# Patient Record
Sex: Female | Born: 1955 | Race: White | Hispanic: No | Marital: Married | Smoking: Current every day smoker
Health system: Southern US, Community
[De-identification: ages and names within clinical notes are randomized; demographics above are authoritative.]

## PROBLEM LIST (undated history)

## (undated) DIAGNOSIS — C801 Malignant (primary) neoplasm, unspecified: Secondary | ICD-10-CM

## (undated) DIAGNOSIS — F32A Depression, unspecified: Secondary | ICD-10-CM

## (undated) DIAGNOSIS — E119 Type 2 diabetes mellitus without complications: Secondary | ICD-10-CM

## (undated) DIAGNOSIS — I10 Essential (primary) hypertension: Secondary | ICD-10-CM

## (undated) DIAGNOSIS — F329 Major depressive disorder, single episode, unspecified: Secondary | ICD-10-CM

## (undated) HISTORY — PX: APPENDECTOMY: SHX54

## (undated) HISTORY — PX: CHOLECYSTECTOMY: SHX55

## (undated) HISTORY — PX: TUBAL LIGATION: SHX77

---

## 1999-07-02 ENCOUNTER — Other Ambulatory Visit: Admission: RE | Admit: 1999-07-02 | Discharge: 1999-07-02 | Payer: Self-pay | Admitting: Obstetrics and Gynecology

## 2000-07-30 ENCOUNTER — Other Ambulatory Visit: Admission: RE | Admit: 2000-07-30 | Discharge: 2000-07-30 | Payer: Self-pay | Admitting: Obstetrics and Gynecology

## 2004-01-29 ENCOUNTER — Encounter: Admission: RE | Admit: 2004-01-29 | Discharge: 2004-01-29 | Payer: Self-pay | Admitting: Family Medicine

## 2005-06-12 ENCOUNTER — Other Ambulatory Visit: Admission: RE | Admit: 2005-06-12 | Discharge: 2005-06-12 | Payer: Self-pay | Admitting: Family Medicine

## 2007-01-25 ENCOUNTER — Other Ambulatory Visit: Admission: RE | Admit: 2007-01-25 | Discharge: 2007-01-25 | Payer: Self-pay | Admitting: Family Medicine

## 2008-07-23 ENCOUNTER — Ambulatory Visit (HOSPITAL_COMMUNITY): Admission: RE | Admit: 2008-07-23 | Discharge: 2008-07-23 | Payer: Self-pay | Admitting: Family Medicine

## 2010-04-09 ENCOUNTER — Other Ambulatory Visit: Admission: RE | Admit: 2010-04-09 | Discharge: 2010-04-09 | Payer: Self-pay | Admitting: Family Medicine

## 2012-05-17 ENCOUNTER — Encounter (HOSPITAL_BASED_OUTPATIENT_CLINIC_OR_DEPARTMENT_OTHER): Payer: PRIVATE HEALTH INSURANCE | Attending: General Surgery

## 2012-05-17 DIAGNOSIS — G5790 Unspecified mononeuropathy of unspecified lower limb: Secondary | ICD-10-CM | POA: Insufficient documentation

## 2012-05-17 DIAGNOSIS — Z79899 Other long term (current) drug therapy: Secondary | ICD-10-CM | POA: Insufficient documentation

## 2012-05-17 DIAGNOSIS — L97509 Non-pressure chronic ulcer of other part of unspecified foot with unspecified severity: Secondary | ICD-10-CM | POA: Insufficient documentation

## 2012-05-17 DIAGNOSIS — I10 Essential (primary) hypertension: Secondary | ICD-10-CM | POA: Insufficient documentation

## 2012-05-17 DIAGNOSIS — E785 Hyperlipidemia, unspecified: Secondary | ICD-10-CM | POA: Insufficient documentation

## 2012-05-17 DIAGNOSIS — E1169 Type 2 diabetes mellitus with other specified complication: Secondary | ICD-10-CM | POA: Insufficient documentation

## 2012-05-17 DIAGNOSIS — Z7982 Long term (current) use of aspirin: Secondary | ICD-10-CM | POA: Insufficient documentation

## 2012-05-17 DIAGNOSIS — F17200 Nicotine dependence, unspecified, uncomplicated: Secondary | ICD-10-CM | POA: Insufficient documentation

## 2012-05-17 LAB — GLUCOSE, CAPILLARY: Glucose-Capillary: 245 mg/dL — ABNORMAL HIGH (ref 70–99)

## 2012-05-17 NOTE — H&P (Signed)
NAMEPRESTYN, Christine Gould                 ACCOUNT NO.:  [id number]  MEDICAL RECORD NO.:  [id number]  LOCATION:  FOOT                         FACILITY:  [facility]  PHYSICIAN:  Joanne Gavel, M.D.        DATE OF BIRTH:  28-Jul-1956  DATE OF ADMISSION:  05/17/2012 DATE OF DISCHARGE:                             HISTORY & PHYSICAL   CHIEF COMPLAINT:  Wounds plantar surface both feet.  HISTORY OF PRESENT ILLNESS:  This is a 56 year old female who has been diabetic for approximately 5 years.  She is not insulin dependent. Several weeks ago she went to a water park where she walked all day with bare feet.  She developed plantar ulcerations bilaterally.  She has been treating these with soaks and antibiotic ointment.  PAST MEDICAL HISTORY:  Significant for diabetes, hyperlipidemia and hypertension.  PAST SURGICAL HISTORY:  Cholecystectomy, tubal ligation, and appendectomy.  SOCIAL HISTORY:  Cigarettes she smokes a pack daily, alcohol social.  ALLERGIES:  ACE INHIBITORS, LOVAZA, LIPITOR, SIMVASTATIN and TOPROL-XL.  MEDICATIONS:  Aspirin, Benicar, citalopram, amlodipine, glyburide, Welchol and trazodone.  REVIEW OF SYSTEMS:  Otherwise negative with no history of heart disease, lung disease or kidney disease.  PHYSICAL EXAMINATION:  VITAL SIGNS:  Temperature 98, pulse 73, respirations 18, blood pressure 121/80, glucose is 245.  ABI on the left is 1.22, on right is 1.32. HEAD, EYES, EARS, NOSE, THROAT:  Negative. CHEST:  Clear. HEART:  Regular rhythm. ABDOMEN:  Not examined. EXTREMITIES:  Examination of extremities reveals significant neuropathy of both feet and lower legs.  Peripheral pulses are not palpable.  There is not much swelling.  On the plantar surface of the left foot, there is 1.0 x 0.8 superficial wound.  On the right foot, there is a 0.3 x 0.2 superficial wound.  Both these wounds are underneath the metatarsal head.  The base of both wounds is relatively shaggy.  There is a  small amount of callus.  IMPRESSION:  Diabetic foot ulcers with neuropathy.  PLAN OF TREATMENT:  X-ray of feet to rule out osteo.  Arterial studies. Check sed rate and hemoglobin A1c.  Start with Santyl, Hydrogel and diabetic footwear to off load.  We will see the patient 2 weeks because she is going to be at the beach next week.  She will come sooner if there is any significant change.     Joanne Gavel, M.D.     RA/MEDQ  D:  05/17/2012  T:  05/17/2012  Job:  [id number]

## 2012-06-14 ENCOUNTER — Encounter (HOSPITAL_BASED_OUTPATIENT_CLINIC_OR_DEPARTMENT_OTHER): Payer: PRIVATE HEALTH INSURANCE

## 2012-07-12 ENCOUNTER — Other Ambulatory Visit (HOSPITAL_COMMUNITY)
Admission: RE | Admit: 2012-07-12 | Discharge: 2012-07-12 | Disposition: A | Discharge status: Home / Self Care | Payer: PRIVATE HEALTH INSURANCE | Source: Ambulatory Visit | Attending: Family Medicine | Admitting: Family Medicine

## 2012-07-12 ENCOUNTER — Other Ambulatory Visit: Payer: Self-pay | Admitting: Family Medicine

## 2012-07-12 DIAGNOSIS — Z124 Encounter for screening for malignant neoplasm of cervix: Secondary | ICD-10-CM | POA: Insufficient documentation

## 2013-11-01 ENCOUNTER — Other Ambulatory Visit: Payer: Self-pay | Admitting: Family Medicine

## 2013-11-01 DIAGNOSIS — R634 Abnormal weight loss: Secondary | ICD-10-CM

## 2013-11-01 DIAGNOSIS — R109 Unspecified abdominal pain: Secondary | ICD-10-CM

## 2013-11-02 ENCOUNTER — Other Ambulatory Visit: Payer: Self-pay | Admitting: Family Medicine

## 2013-11-02 ENCOUNTER — Ambulatory Visit
Admission: RE | Admit: 2013-11-02 | Discharge: 2013-11-02 | Disposition: A | Discharge status: Home / Self Care | Payer: PRIVATE HEALTH INSURANCE | Source: Ambulatory Visit | Attending: Family Medicine | Admitting: Family Medicine

## 2013-11-02 DIAGNOSIS — K8689 Other specified diseases of pancreas: Secondary | ICD-10-CM

## 2013-11-02 DIAGNOSIS — R160 Hepatomegaly, not elsewhere classified: Secondary | ICD-10-CM

## 2013-11-02 DIAGNOSIS — R109 Unspecified abdominal pain: Secondary | ICD-10-CM

## 2013-11-02 DIAGNOSIS — R634 Abnormal weight loss: Secondary | ICD-10-CM

## 2013-11-03 ENCOUNTER — Other Ambulatory Visit: Payer: Self-pay | Admitting: Oncology

## 2013-11-03 ENCOUNTER — Ambulatory Visit
Admission: RE | Admit: 2013-11-03 | Discharge: 2013-11-03 | Disposition: A | Discharge status: Home / Self Care | Payer: PRIVATE HEALTH INSURANCE | Source: Ambulatory Visit | Attending: Family Medicine | Admitting: Family Medicine

## 2013-11-03 ENCOUNTER — Telehealth: Payer: Self-pay | Admitting: Oncology

## 2013-11-03 DIAGNOSIS — K8689 Other specified diseases of pancreas: Secondary | ICD-10-CM

## 2013-11-03 DIAGNOSIS — R160 Hepatomegaly, not elsewhere classified: Secondary | ICD-10-CM

## 2013-11-03 DIAGNOSIS — C259 Malignant neoplasm of pancreas, unspecified: Secondary | ICD-10-CM

## 2013-11-03 MED ORDER — IOHEXOL 300 MG/ML  SOLN
100.0000 mL | Freq: Once | INTRAMUSCULAR | Status: AC | PRN
  Administered 2013-11-03: 100 mL via INTRAVENOUS

## 2013-11-03 NOTE — Telephone Encounter (Signed)
PATIENT SCHEDULED TO SEE DR. SHADAD 02/04 @ 1:30 PATIENT AWARE REFERRING DR. [name] [name] DX- LIVER/PANCREATIC MASS WELCOME PACKET MAILED.

## 2013-11-03 NOTE — Telephone Encounter (Signed)
C/D 11/03/13 for appt. 11/08/13

## 2013-11-06 ENCOUNTER — Encounter (HOSPITAL_COMMUNITY): Payer: Self-pay | Admitting: *Deleted

## 2013-11-06 ENCOUNTER — Other Ambulatory Visit: Payer: Self-pay | Admitting: Gastroenterology

## 2013-11-06 ENCOUNTER — Encounter (HOSPITAL_COMMUNITY): Payer: Self-pay | Admitting: Pharmacy Technician

## 2013-11-06 NOTE — Addendum Note (Signed)
Addended by: Arta Silence on: 11/06/2013 02:59 PM   Modules accepted: Orders

## 2013-11-07 ENCOUNTER — Ambulatory Visit (HOSPITAL_COMMUNITY): Payer: PRIVATE HEALTH INSURANCE | Admitting: Anesthesiology

## 2013-11-07 ENCOUNTER — Encounter (HOSPITAL_COMMUNITY)
Admission: RE | Disposition: A | Discharge status: Home / Self Care | Payer: Self-pay | Source: Ambulatory Visit | Attending: Gastroenterology

## 2013-11-07 ENCOUNTER — Encounter (HOSPITAL_COMMUNITY): Payer: Self-pay

## 2013-11-07 ENCOUNTER — Ambulatory Visit (HOSPITAL_COMMUNITY)
Admission: RE | Admit: 2013-11-07 | Discharge: 2013-11-07 | Disposition: A | Discharge status: Home / Self Care | Payer: PRIVATE HEALTH INSURANCE | Source: Ambulatory Visit | Attending: Gastroenterology | Admitting: Gastroenterology

## 2013-11-07 ENCOUNTER — Encounter (HOSPITAL_COMMUNITY): Payer: PRIVATE HEALTH INSURANCE | Admitting: Anesthesiology

## 2013-11-07 DIAGNOSIS — E119 Type 2 diabetes mellitus without complications: Secondary | ICD-10-CM | POA: Insufficient documentation

## 2013-11-07 DIAGNOSIS — F17200 Nicotine dependence, unspecified, uncomplicated: Secondary | ICD-10-CM | POA: Insufficient documentation

## 2013-11-07 DIAGNOSIS — C252 Malignant neoplasm of tail of pancreas: Secondary | ICD-10-CM | POA: Insufficient documentation

## 2013-11-07 DIAGNOSIS — I10 Essential (primary) hypertension: Secondary | ICD-10-CM | POA: Insufficient documentation

## 2013-11-07 DIAGNOSIS — Z79899 Other long term (current) drug therapy: Secondary | ICD-10-CM | POA: Insufficient documentation

## 2013-11-07 HISTORY — DX: Essential (primary) hypertension: I10

## 2013-11-07 HISTORY — DX: Malignant (primary) neoplasm, unspecified: C80.1

## 2013-11-07 HISTORY — PX: FINE NEEDLE ASPIRATION: SHX5430

## 2013-11-07 HISTORY — DX: Type 2 diabetes mellitus without complications: E11.9

## 2013-11-07 HISTORY — DX: Major depressive disorder, single episode, unspecified: F32.9

## 2013-11-07 HISTORY — PX: EUS: SHX5427

## 2013-11-07 HISTORY — DX: Depression, unspecified: F32.A

## 2013-11-07 LAB — GLUCOSE, CAPILLARY: Glucose-Capillary: 198 mg/dL — ABNORMAL HIGH (ref 70–99)

## 2013-11-07 SURGERY — ESOPHAGEAL ENDOSCOPIC ULTRASOUND (EUS) RADIAL
Anesthesia: Monitor Anesthesia Care

## 2013-11-07 MED ORDER — PROPOFOL 10 MG/ML IV BOLUS
INTRAVENOUS | Status: AC
  Filled 2013-11-07: qty 20

## 2013-11-07 MED ORDER — ONDANSETRON HCL 4 MG/2ML IJ SOLN
INTRAMUSCULAR | Status: DC | PRN
  Administered 2013-11-07: 4 mg via INTRAVENOUS

## 2013-11-07 MED ORDER — LACTATED RINGERS IV SOLN
INTRAVENOUS | Status: DC | PRN
  Administered 2013-11-07 (×2): via INTRAVENOUS

## 2013-11-07 MED ORDER — MIDAZOLAM HCL 2 MG/2ML IJ SOLN
INTRAMUSCULAR | Status: AC
  Filled 2013-11-07: qty 2

## 2013-11-07 MED ORDER — KETAMINE HCL 10 MG/ML IJ SOLN
INTRAMUSCULAR | Status: AC
  Filled 2013-11-07: qty 1

## 2013-11-07 MED ORDER — BUTAMBEN-TETRACAINE-BENZOCAINE 2-2-14 % EX AERO
INHALATION_SPRAY | CUTANEOUS | Status: DC | PRN
  Administered 2013-11-07: 2 via TOPICAL

## 2013-11-07 MED ORDER — PROPOFOL INFUSION 10 MG/ML OPTIME
INTRAVENOUS | Status: DC | PRN
  Administered 2013-11-07: 75 ug/kg/min via INTRAVENOUS

## 2013-11-07 MED ORDER — FENTANYL CITRATE 0.05 MG/ML IJ SOLN
INTRAMUSCULAR | Status: AC
  Filled 2013-11-07: qty 2

## 2013-11-07 MED ORDER — MIDAZOLAM HCL 5 MG/5ML IJ SOLN
INTRAMUSCULAR | Status: DC | PRN
  Administered 2013-11-07: 2 mg via INTRAVENOUS

## 2013-11-07 MED ORDER — PROPOFOL 10 MG/ML IV BOLUS
INTRAVENOUS | Status: DC | PRN
  Administered 2013-11-07 (×2): 20 mg via INTRAVENOUS

## 2013-11-07 NOTE — Anesthesia Preprocedure Evaluation (Signed)
Anesthesia Evaluation  Patient identified by MRN, date of birth, ID band Patient awake    Reviewed: Allergy & Precautions, H&P , NPO status , Patient's Chart, lab work & pertinent test results  Airway Mallampati: II TM Distance: >3 FB Neck ROM: Full    Dental  (+) Teeth Intact and Dental Advisory Given   Pulmonary neg pulmonary ROS, Current Smoker,  breath sounds clear to auscultation  Pulmonary exam normal       Cardiovascular hypertension, Pt. on medications Rhythm:Regular Rate:Normal     Neuro/Psych negative neurological ROS  negative psych ROS   GI/Hepatic Neg liver ROS, Pancreatic mass   Endo/Other  diabetes, Type 2, Oral Hypoglycemic Agents  Renal/GU negative Renal ROS  negative genitourinary   Musculoskeletal negative musculoskeletal ROS (+)   Abdominal   Peds negative pediatric ROS (+)  Hematology negative hematology ROS (+)   Anesthesia Other Findings   Reproductive/Obstetrics                           Anesthesia Physical Anesthesia Plan  ASA: II  Anesthesia Plan: MAC   Post-op Pain Management:    Induction: Intravenous  Airway Management Planned: Simple Face Mask and Nasal Cannula  Additional Equipment:   Intra-op Plan:   Post-operative Plan:   Informed Consent:   Dental advisory given  Plan Discussed with: CRNA  Anesthesia Plan Comments:         Anesthesia Quick Evaluation

## 2013-11-07 NOTE — H&P (Signed)
Patient interval history reviewed.  Patient examined again.  There has been no change from documented H/P dated 11/03/13 (scanned into chart from our office) except as documented above.  Assessment:  1. Pancreatic mass, with concern for malignancy with carcinomatosis.  Plan:  1.  Endoscopic ultrasound with possible fine needle aspiration. 2.  Risks (bleeding, infection, bowel perforation that could require surgery, sedation-related changes in cardiopulmonary systems), benefits (identification and possible treatment of source of symptoms, exclusion of certain causes of symptoms), and alternatives (watchful waiting, radiographic imaging studies, empiric medical treatment) of upper endoscopy with ultrasound and possible fine needle aspiration biopsies (EUS +/- FNA) were explained to patient/family in detail and patient wishes to proceed.

## 2013-11-07 NOTE — Discharge Instructions (Signed)

## 2013-11-07 NOTE — Op Note (Signed)
[facility] [place], [address]   ENDOSCOPIC ULTRASOUND PROCEDURE REPORT  PATIENT: Christine Gould, Christine Gould  MR#: [id number] BIRTHDATE: 1956/03/21  GENDER: Female ENDOSCOPIST: Arta Silence, MD REFERRED BY:  Acquanetta Sit, M.D. PROCEDURE DATE:  11/07/2013 PROCEDURE:   Upper EUS w/FNA ASA CLASS:      Class III INDICATIONS:   1.  pancreatic mass. MEDICATIONS: MAC sedation, administered by CRNA and Cetacaine spray x 2  DESCRIPTION OF PROCEDURE:   After the risks benefits and alternatives of the procedure were  explained, informed consent was obtained. The patient was then placed in the left, lateral, decubitus postion and IV sedation was administered. Throughout the procedure, the patients blood pressure, pulse and oxygen saturations were monitored continuously.  Under direct visualization, the     endoscope was introduced through the mouth and advanced to the stomach antrum .  Water was used as necessary to provide an acoustic interface.  Upon completion of the imaging, water was removed and the patient was sent to the recovery room in satisfactory condition.     FINDINGS:      Large irregular hypoechoic mass in tail of pancreas, at least 4 x 4 cm in size.  Mass biopsied x 3 with 25-g needle, preliminary cytology, reviewed in my presence by Dr. Nicoletta Dress, worrisome for adenocarcinoma.  Notation made of recent CT scan showing extensive peritumoral and distant adenopathy, as well as carcinomatosis.  IMPRESSION:     Pancreatic mass, biopsied as above.  RECOMMENDATIONS:     1.  Watch for potential complications of procedure. 2.  Await final cytology results. 3.  Patient does not appear to be operative candidate; if biopsies confirm adenocarcinoma, would need to set up expedited oncology consultation for further management recommendations.   _______________________________ Lorrin MaisArta Silence, MD 11/07/2013 3:48 PM   CC:

## 2013-11-07 NOTE — Anesthesia Postprocedure Evaluation (Signed)
Anesthesia Post Note  Patient: Christine Gould  Procedure(s) Performed: Procedure(s) (LRB): ESOPHAGEAL ENDOSCOPIC ULTRASOUND (EUS) RADIAL (N/A) FINE NEEDLE ASPIRATION (FNA) LINEAR (N/A)  Anesthesia type: General  Patient location: PACU  Post pain: Pain level controlled  Post assessment: Post-op Vital signs reviewed  Last Vitals:  Filed Vitals:   11/07/13 1550  BP: 146/110  Temp:   Resp: 23    Post vital signs: Reviewed  Level of consciousness: sedated  Complications: No apparent anesthesia complications

## 2013-11-07 NOTE — Preoperative (Signed)
Beta Blockers   Reason not to administer Beta Blockers:Not Applicable 

## 2013-11-07 NOTE — Transfer of Care (Signed)
Immediate Anesthesia Transfer of Care Note  Patient: Christine Gould  Procedure(s) Performed: Procedure(s) (LRB): ESOPHAGEAL ENDOSCOPIC ULTRASOUND (EUS) RADIAL (N/A) FINE NEEDLE ASPIRATION (FNA) LINEAR (N/A)  Patient Location: PACU  Anesthesia Type: MAC  Level of Consciousness: sedated, patient cooperative and responds to stimulation  Airway & Oxygen Therapy: Patient Spontanous Breathing and Patient connected to face mask oxgen  Post-op Assessment: Report given to PACU RN and Post -op Vital signs reviewed and stable  Post vital signs: Reviewed and stable  Complications: No apparent anesthesia complications

## 2013-11-08 ENCOUNTER — Ambulatory Visit (HOSPITAL_BASED_OUTPATIENT_CLINIC_OR_DEPARTMENT_OTHER): Payer: PRIVATE HEALTH INSURANCE | Admitting: Oncology

## 2013-11-08 ENCOUNTER — Other Ambulatory Visit (HOSPITAL_BASED_OUTPATIENT_CLINIC_OR_DEPARTMENT_OTHER): Payer: PRIVATE HEALTH INSURANCE

## 2013-11-08 ENCOUNTER — Ambulatory Visit: Payer: PRIVATE HEALTH INSURANCE

## 2013-11-08 ENCOUNTER — Encounter: Payer: Self-pay | Admitting: Oncology

## 2013-11-08 ENCOUNTER — Encounter (HOSPITAL_COMMUNITY): Payer: Self-pay | Admitting: Gastroenterology

## 2013-11-08 VITALS — BP 117/71 | HR 98 | Temp 97.8°F | Resp 18 | Ht 65.0 in | Wt 166.4 lb

## 2013-11-08 DIAGNOSIS — C252 Malignant neoplasm of tail of pancreas: Secondary | ICD-10-CM

## 2013-11-08 DIAGNOSIS — C259 Malignant neoplasm of pancreas, unspecified: Secondary | ICD-10-CM

## 2013-11-08 DIAGNOSIS — C787 Secondary malignant neoplasm of liver and intrahepatic bile duct: Secondary | ICD-10-CM

## 2013-11-08 DIAGNOSIS — R109 Unspecified abdominal pain: Secondary | ICD-10-CM

## 2013-11-08 DIAGNOSIS — E119 Type 2 diabetes mellitus without complications: Secondary | ICD-10-CM

## 2013-11-08 DIAGNOSIS — F329 Major depressive disorder, single episode, unspecified: Secondary | ICD-10-CM

## 2013-11-08 DIAGNOSIS — C786 Secondary malignant neoplasm of retroperitoneum and peritoneum: Secondary | ICD-10-CM

## 2013-11-08 DIAGNOSIS — F3289 Other specified depressive episodes: Secondary | ICD-10-CM

## 2013-11-08 DIAGNOSIS — I10 Essential (primary) hypertension: Secondary | ICD-10-CM

## 2013-11-08 LAB — CBC WITH DIFFERENTIAL/PLATELET
BASO%: 0.6 % (ref 0.0–2.0)
BASOS ABS: 0 10*3/uL (ref 0.0–0.1)
EOS%: 0.4 % (ref 0.0–7.0)
Eosinophils Absolute: 0 10*3/uL (ref 0.0–0.5)
HEMATOCRIT: 43.5 % (ref 34.8–46.6)
HGB: 14.6 g/dL (ref 11.6–15.9)
LYMPH%: 22.2 % (ref 14.0–49.7)
MCH: 31.7 pg (ref 25.1–34.0)
MCHC: 33.4 g/dL (ref 31.5–36.0)
MCV: 94.8 fL (ref 79.5–101.0)
MONO#: 0.5 10*3/uL (ref 0.1–0.9)
MONO%: 7.6 % (ref 0.0–14.0)
NEUT#: 4.8 10*3/uL (ref 1.5–6.5)
NEUT%: 69.2 % (ref 38.4–76.8)
PLATELETS: 174 10*3/uL (ref 145–400)
RBC: 4.59 10*6/uL (ref 3.70–5.45)
RDW: 13.1 % (ref 11.2–14.5)
WBC: 6.9 10*3/uL (ref 3.9–10.3)
lymph#: 1.5 10*3/uL (ref 0.9–3.3)

## 2013-11-08 LAB — COMPREHENSIVE METABOLIC PANEL (CC13)
ALT: 25 U/L (ref 0–55)
ANION GAP: 15 meq/L — AB (ref 3–11)
AST: 19 U/L (ref 5–34)
Albumin: 3.5 g/dL (ref 3.5–5.0)
Alkaline Phosphatase: 108 U/L (ref 40–150)
BUN: 4.1 mg/dL — AB (ref 7.0–26.0)
CALCIUM: 9.6 mg/dL (ref 8.4–10.4)
CO2: 25 meq/L (ref 22–29)
CREATININE: 0.7 mg/dL (ref 0.6–1.1)
Chloride: 93 mEq/L — ABNORMAL LOW (ref 98–109)
Glucose: 287 mg/dl — ABNORMAL HIGH (ref 70–140)
Potassium: 3.7 mEq/L (ref 3.5–5.1)
Sodium: 133 mEq/L — ABNORMAL LOW (ref 136–145)
Total Bilirubin: 0.38 mg/dL (ref 0.20–1.20)
Total Protein: 6.5 g/dL (ref 6.4–8.3)

## 2013-11-08 NOTE — Progress Notes (Signed)
Checked in new patient she has appt card and no financial issues.

## 2013-11-08 NOTE — Consult Note (Signed)
Reason for Referral: Pancreatic cancer.   HPI: 58 year old woman currently of [place] where she lived the majority of her life. She has a past medical history significant for hypertension and diabetes that have been otherwise in reasonably good health. 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 Her symptoms started about 2 months ago with abdominal pain and weight loss. Her pain is predominantly epigastric causing a band like discomfort and radiating to the back. Is not associated with any nausea or vomiting. Some associated with any change in her bowel habits including hematochezia or diarrhea. She has noticed weight loss and change in taste of her food. She was evaluated by her primary care physician initially with an ultrasound on 11/02/2013 which showed masses around the right lower liver as well as pancreatic mass. Her CT scan of the abdomen and pelvis showed a poorly defined tail of the pancreas mass measuring 5.4 x 4.0 associated with peripancreatic lymph nodes as well as carcinomatosis. There is also tumors in the liver involving the left upper quadrant at 8.4 and 2.6 cm. Patient subsequently underwent an endoscopy and a biopsy utilizing endoscopic ultrasound on 11/07/2013. The results from that fine-needle aspiration case number [id number] showed malignant cells most likely consistent with poorly differentiated adenocarcinoma. Patient referred to me for the evaluation and management of these findings. Clinically, she is quite symptomatic from this she is reporting midepigastric abdominal pain, fatigue and weight loss. She has not reported any chills or night sweats. She has not reported any jaundice or lower extremity edema or thrombosis. Her abdominal pain is poorly controlled at times. She still has a reasonable performance status and was able to work up to last week.   Past Medical History  Diagnosis Date  . Hypertension   . Depression   . Diabetes mellitus without complication    . Cancer     Dx. 11-02-13 Pancreatic Cancer  :  Past Surgical History  Procedure Laterality Date  . Appendectomy    . Tubal ligation    . Cholecystectomy      open 70's  . Eus N/A 11/07/2013    Procedure: ESOPHAGEAL ENDOSCOPIC ULTRASOUND (EUS) RADIAL;  Surgeon: Arta Silence, MD;  Location: [facility];  Service: Endoscopy;  Laterality: N/A;  . Fine needle aspiration N/A 11/07/2013    Procedure: FINE NEEDLE ASPIRATION (FNA) LINEAR;  Surgeon: Arta Silence, MD;  Location: [facility];  Service: Endoscopy;  Laterality: N/A;  :  No current outpatient prescriptions on file.:  Allergies  Allergen Reactions  . Statins Other (See Comments)    Cramps-fatigue  . Toprol Xl [Metoprolol]   :  No family history on file.:  History   Social History  . Marital Status: Married    Spouse Name: N/A    Number of Children: N/A  . Years of Education: N/A   Occupational History  . Not on file.   Social History Main Topics  . Smoking status: Current Every Day Smoker -- 0.50 packs/day for 35 years    Types: Cigarettes  . Smokeless tobacco: Not on file  . Alcohol Use: Yes     Comment: occ. -social  . Drug Use: No  . Sexual Activity: Not Currently   Other Topics Concern  . Not on file   Social History Narrative  . No narrative on file  :  Constitutional: negative for chills, fevers and night sweats Eyes: negative for cataracts, icterus and redness Ears, nose, mouth, throat, and face: negative for ear drainage,  hearing loss and sore mouth Respiratory: negative for dyspnea on exertion, hemoptysis and wheezing Cardiovascular: negative for exertional chest pressure/discomfort, irregular heart beat, orthopnea and paroxysmal nocturnal dyspnea Gastrointestinal: negative for diarrhea, jaundice, melena and vomiting Genitourinary:negative for frequency, hematuria and nocturia Integument/breast: negative for pruritus, rash and skin color change Hematologic/lymphatic: negative for bleeding,  easy bruising and lymphadenopathy Musculoskeletal:negative for arthralgias, back pain and bone pain Neurological: negative for coordination problems Behavioral/Psych: negative for behavior problems and depression Endocrine: negative for temperature intolerance Allergic/Immunologic: negative for hay fever and urticaria  Exam: Blood pressure 117/71, pulse 98, temperature 97.8 F (36.6 C), temperature source Oral, resp. rate 18, height 5\' 5"  (1.651 m), weight 166 lb 6.4 oz (75.479 kg). General appearance: alert, cooperative and appears stated age Head: Normocephalic, without obvious abnormality, atraumatic Nose: Nares normal. Septum midline. Mucosa normal. No drainage or sinus tenderness. Throat: lips, mucosa, and tongue normal; teeth and gums normal Neck: no adenopathy, no carotid bruit, no JVD, supple, symmetrical, trachea midline and thyroid not enlarged, symmetric, no tenderness/mass/nodules Back: symmetric, no curvature. ROM normal. No CVA tenderness. Resp: clear to auscultation bilaterally Chest wall: no tenderness Cardio: regular rate and rhythm, S1, S2 normal, no murmur, click, rub or gallop GI: soft, non-tender; bowel sounds normal; no masses,  no organomegaly Extremities: extremities normal, atraumatic, no cyanosis or edema Pulses: 2+ and symmetric Skin: Skin color, texture, turgor normal. No rashes or lesions Lymph nodes: Cervical, supraclavicular, and axillary nodes normal. Neurologic: Grossly normal   Recent Labs  11/08/13 1322  WBC 6.9  HGB 14.6  HCT 43.5  PLT 174    Recent Labs  11/08/13 1323  NA 133*  K 3.7  CO2 25  GLUCOSE 287*  BUN 4.1*  CREATININE 0.7  CALCIUM 9.6       US Abdomen Complete  11/02/2013   CLINICAL DATA:  Abdominal pain and weight loss.  EXAM: ULTRASOUND ABDOMEN COMPLETE  COMPARISON:  Ultrasound dated 01/29/2004  FINDINGS: Gallbladder:  Removed.  Common bile duct:  Diameter: 2.9 mm, normal.  Liver:  There is a 3.6 x 3.2 x 1.6 cm mixed  solid and cystic mass at inferior margin of the right lobe of the liver. There is a 1.7 x 1.1 x 1.6 cm mass which line is anterior to the left lobe of the liver.  IVC:  Normal.  Pancreas:  There is a poorly defined at 3.7 x 2.7 x 3.2 cm mass in the head of the pancreas. There is a better defined solid mass in the mid pancreas measuring 2.5 x 2.0 x 2.0 cm.  Spleen:  Normal.  8.2 cm in length.  Right Kidney:  Length: 11.8 cm. 1.9 cm cyst in the mid right kidney. Otherwise normal.  Left Kidney:  Length: 1.7 cm. Echogenicity within normal limits. No mass or hydronephrosis visualized.  Abdominal aorta:  Normal.  2.5 cm maximum diameter.  Other findings:  Small amount of ascites.  IMPRESSION: Masses in the liver and in and around the pancreas as well as anterior to the left lobe of the liver. Findings are consistent with malignancy. Small amount of ascites.   Electronically Signed   By: Rozetta Nunnery M.D.   On: 11/02/2013 09:26   Ct Abdomen Pelvis W Contrast  11/03/2013   CLINICAL DATA:  Abnormal ultrasound abdomen with masses at liver and pancreas, abdominal pain, nausea, weight loss, past history cholecystectomy, appendectomy, tubal ligation  EXAM: CT ABDOMEN AND PELVIS WITH CONTRAST  TECHNIQUE: Multidetector CT imaging of the abdomen and pelvis  was performed using the standard protocol following bolus administration of intravenous contrast. Sagittal and coronal MPR images reconstructed from axial data set.  CONTRAST:  163mL OMNIPAQUE IOHEXOL 300 MG/ML SOLN. Dilute oral contrast.  COMPARISON:  Ultrasound abdomen 11/02/2013  FINDINGS: Lung bases clear.  Poorly defined low-attenuation mass at pancreatic tail consistent with primary pancreatic neoplasm.  Lesion measures 5.4 x 4.0 cm image 27 and is contiguous with the posterior gastric wall.  Lesion extends to very close to the hepatic flexure of the colon.  Additionally, enlarged peripancreatic lymph nodes identified, 2.8 x 2.2 cm image 32 and 2.3 x 1.8 cm image 29.   Multiple peritoneal based soft tissue masses/ nodules identified knee upper abdomen compatible with peritoneal carcinomatosis.  These include a large mass of the lateral left upper quadrant, 8.4 x 2.6 cm image 42 and small perihepatic nodules as well as a serosal mass at the inferior right lobe 3.4 by 2.4 cm image 32.  Small amount of perihepatic and perisplenic free intraperitoneal fluid.  Scattered omental infiltration.  Small amount of free fluid in pelvis with nodular enhancing margins compatible with peritoneal carcinomatosis as well.  No additional abnormalities of the liver, spleen, adrenal glands, or kidneys.  Remainder of pancreas normal appearance.  No evidence of bowel obstruction or dilatation.  Unremarkable bladder, uterus and adnexae.  No definite osseous lesions identified.  IMPRESSION: 5.4 x 4.0 cm diameter pancreatic tail mass most consistent with a primary pancreatic neoplasm.  Question extension of tumor to posterior gastric wall and very near the splenic flexure of the colon.  Peripancreatic adenopathy and significant peritoneal carcinomatosis changes as above.   Electronically Signed   By: Lavonia Dana M.D.   On: 11/03/2013 16:15    Assessment and Plan:   58 year old woman with the following issues:  1. 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 This was diagnosed with an endoscopic ultrasound fine-needle aspiration on 11/07/2013. She presented with abdominal pain and weight loss. The natural course of stage IV pancreatic cancer was discussed today with the patient and her family. I have explained to her the treatment options at this time extensively. I do not believe there is a role for surgery given the fact that her disease is advanced and rather bulky. Radiation therapy would not likely add any palliation for her pain at this time and that leaves [place] with systemic  chemotherapy. I discussed with her the role and goals of systemic chemotherapy in general in this particular situation. I made it clear to her and her family that systemic chemotherapy is mainly palliative and we'll offer little to no chance of to remission.  Different regimens of systemic chemotherapy were discussed today in detail. These include single agent gemcitabine, combination of gemcitabine and Abraxane, and combination of 5-FU, leucovorin, oxaliplatin, and CPT-11. Risks and benefits of these regimens were discussed today in detail including the logistics of administration and complications. I have explained to them that in general chemotherapy can cause nausea, vomiting, myelosuppression and possible neutropenia and neutropenic sepsis. The risk of adding Abraxane increase his peripheral neuropathy and the combination of CPT-11 and oxaliplatin can cause more neuropathy as well as diarrhea and increased risk of neutropenic sepsis and possible hospitalization. They also understand that and doesn't find treatment improves the overall survival but certainly not curative.  Of also explained to her the logistics of intravenous chemotherapy administration including the need as well as the use of  supportive measures such as Port-A-Cath insertion, the use of anti-emetic.  Alternatively, if she decided not to proceed with systemic chemotherapy the logical conclusion would be the cancer continued to spread and eminently cause more pain and discomfort and referral to hospice.  2. Abdominal pain: She is currently utilizing hydrocodone with acetaminophen and she might require stronger pain medication and possibly long-acting narcotics.  3. Constipation: Is going to be possibly an issue especially with utilizing stronger narcotics and dehydration an adequate bowel regimen is very critical.  4. Thrombosis: I've explained to her the risks associated with this particular malignancy would increase the risk of  thromboembolism I have educated her on possible ways prevented in the future.  Clearly the all prognosis is poor and she clearly understands this and she is to decide with her family which we will like to proceed and we will arrange for a followup and infusions of chemotherapy should she decides to proceed with that.

## 2013-11-08 NOTE — Progress Notes (Signed)
Please see consult note.  

## 2013-11-09 LAB — CANCER ANTIGEN 19-9: CA 19 9: 37431.5 U/mL — AB (ref <35.0)

## 2013-11-10 ENCOUNTER — Other Ambulatory Visit: Payer: Self-pay | Admitting: *Deleted

## 2013-11-10 ENCOUNTER — Telehealth: Payer: Self-pay | Admitting: *Deleted

## 2013-11-10 MED ORDER — LIDOCAINE-PRILOCAINE 2.5-2.5 % EX CREA: 1.0000 "application " | TOPICAL_CREAM | CUTANEOUS | Status: DC | PRN

## 2013-11-10 MED ORDER — OXYCODONE HCL 5 MG PO TABS: 5.0000 mg | ORAL_TABLET | ORAL | Status: DC | PRN

## 2013-11-10 MED ORDER — PROCHLORPERAZINE MALEATE 10 MG PO TABS: 10.0000 mg | ORAL_TABLET | Freq: Four times a day (QID) | ORAL | Status: DC | PRN

## 2013-11-10 NOTE — Addendum Note (Signed)
Addended by: Wyatt Portela on: 11/10/2013 01:54 PM   Modules accepted: Orders

## 2013-11-10 NOTE — Telephone Encounter (Signed)
E-scribed emla cream and compazine to patient's pharmacy.called patient, left message on answering machine to call me.

## 2013-11-10 NOTE — Telephone Encounter (Signed)
No note

## 2013-11-10 NOTE — Telephone Encounter (Signed)
Patient calling for something for pain. Per dr Alen Blew, oxycodone 5mg  script left at front. Patient notified.

## 2013-11-10 NOTE — Telephone Encounter (Signed)
Patient returned my call, states central scheduling has already called her to set up port-a-cath placement. emla and compazine called to her pharmacy. Patient aware.

## 2013-11-13 ENCOUNTER — Telehealth: Payer: Self-pay | Admitting: Medical Oncology

## 2013-11-13 ENCOUNTER — Telehealth: Payer: Self-pay | Admitting: Oncology

## 2013-11-13 NOTE — Telephone Encounter (Signed)
Patient called confirming appt for Chemo education class sched for 02/12 @ 1230.  Denies questions at this time.

## 2013-11-13 NOTE — Telephone Encounter (Signed)
lvm for pt regarding to Feb appts...amield pt appt sched/avs and letter

## 2013-11-14 ENCOUNTER — Other Ambulatory Visit: Payer: Self-pay | Admitting: Radiology

## 2013-11-14 ENCOUNTER — Other Ambulatory Visit: Payer: PRIVATE HEALTH INSURANCE

## 2013-11-14 ENCOUNTER — Ambulatory Visit: Payer: PRIVATE HEALTH INSURANCE

## 2013-11-15 ENCOUNTER — Encounter (HOSPITAL_COMMUNITY): Payer: Self-pay | Admitting: Pharmacy Technician

## 2013-11-16 ENCOUNTER — Other Ambulatory Visit: Payer: PRIVATE HEALTH INSURANCE

## 2013-11-16 ENCOUNTER — Encounter: Payer: Self-pay | Admitting: *Deleted

## 2013-11-16 NOTE — Progress Notes (Signed)
Patient brought FMLA forms to chemo class.  Given to managed care.

## 2013-11-17 ENCOUNTER — Encounter: Payer: Self-pay | Admitting: Oncology

## 2013-11-17 ENCOUNTER — Ambulatory Visit: Payer: PRIVATE HEALTH INSURANCE | Admitting: Oncology

## 2013-11-17 ENCOUNTER — Ambulatory Visit: Payer: PRIVATE HEALTH INSURANCE

## 2013-11-17 NOTE — Progress Notes (Signed)
Put [employer] and fmla form in registration desk.

## 2013-11-17 NOTE — Progress Notes (Signed)
Faxed disability form to [employer] @ [phone] °

## 2013-11-20 ENCOUNTER — Ambulatory Visit (HOSPITAL_COMMUNITY)
Admission: RE | Admit: 2013-11-20 | Discharge: 2013-11-20 | Disposition: A | Discharge status: Home / Self Care | Payer: PRIVATE HEALTH INSURANCE | Source: Ambulatory Visit | Attending: Oncology | Admitting: Oncology

## 2013-11-20 ENCOUNTER — Other Ambulatory Visit: Payer: Self-pay | Admitting: Oncology

## 2013-11-20 ENCOUNTER — Encounter (HOSPITAL_COMMUNITY): Payer: Self-pay

## 2013-11-20 DIAGNOSIS — F3289 Other specified depressive episodes: Secondary | ICD-10-CM | POA: Insufficient documentation

## 2013-11-20 DIAGNOSIS — I10 Essential (primary) hypertension: Secondary | ICD-10-CM | POA: Insufficient documentation

## 2013-11-20 DIAGNOSIS — F329 Major depressive disorder, single episode, unspecified: Secondary | ICD-10-CM | POA: Insufficient documentation

## 2013-11-20 DIAGNOSIS — C259 Malignant neoplasm of pancreas, unspecified: Secondary | ICD-10-CM

## 2013-11-20 DIAGNOSIS — Z79899 Other long term (current) drug therapy: Secondary | ICD-10-CM | POA: Insufficient documentation

## 2013-11-20 DIAGNOSIS — E119 Type 2 diabetes mellitus without complications: Secondary | ICD-10-CM | POA: Insufficient documentation

## 2013-11-20 DIAGNOSIS — F17200 Nicotine dependence, unspecified, uncomplicated: Secondary | ICD-10-CM | POA: Insufficient documentation

## 2013-11-20 LAB — CBC
HCT: 42.4 % (ref 36.0–46.0)
Hemoglobin: 14.9 g/dL (ref 12.0–15.0)
MCH: 31.5 pg (ref 26.0–34.0)
MCHC: 35.1 g/dL (ref 30.0–36.0)
MCV: 89.6 fL (ref 78.0–100.0)
Platelets: 172 10*3/uL (ref 150–400)
RBC: 4.73 MIL/uL (ref 3.87–5.11)
RDW: 12.5 % (ref 11.5–15.5)
WBC: 6 10*3/uL (ref 4.0–10.5)

## 2013-11-20 LAB — GLUCOSE, CAPILLARY: Glucose-Capillary: 198 mg/dL — ABNORMAL HIGH (ref 70–99)

## 2013-11-20 LAB — APTT: aPTT: 27 seconds (ref 24–37)

## 2013-11-20 LAB — PROTIME-INR
INR: 1.02 (ref 0.00–1.49)
Prothrombin Time: 13.2 s (ref 11.6–15.2)

## 2013-11-20 MED ORDER — FENTANYL CITRATE 0.05 MG/ML IJ SOLN
INTRAMUSCULAR | Status: AC
  Filled 2013-11-20: qty 6

## 2013-11-20 MED ORDER — HEPARIN SOD (PORK) LOCK FLUSH 100 UNIT/ML IV SOLN
INTRAVENOUS | Status: AC | PRN
  Administered 2013-11-20: 500 [IU]

## 2013-11-20 MED ORDER — CEFAZOLIN SODIUM-DEXTROSE 2-3 GM-% IV SOLR
2.0000 g | INTRAVENOUS | Status: AC
  Administered 2013-11-20: 2 g via INTRAVENOUS
  Filled 2013-11-20: qty 50

## 2013-11-20 MED ORDER — LIDOCAINE-EPINEPHRINE (PF) 2 %-1:200000 IJ SOLN
INTRAMUSCULAR | Status: AC
  Filled 2013-11-20: qty 20

## 2013-11-20 MED ORDER — HEPARIN SOD (PORK) LOCK FLUSH 100 UNIT/ML IV SOLN
INTRAVENOUS | Status: DC
Start: 2013-11-20 — End: 2013-11-21
  Filled 2013-11-20: qty 5

## 2013-11-20 MED ORDER — FENTANYL CITRATE 0.05 MG/ML IJ SOLN
INTRAMUSCULAR | Status: AC | PRN
  Administered 2013-11-20: 50 ug via INTRAVENOUS
  Administered 2013-11-20 (×2): 25 ug via INTRAVENOUS

## 2013-11-20 MED ORDER — MIDAZOLAM HCL 2 MG/2ML IJ SOLN
INTRAMUSCULAR | Status: AC
  Filled 2013-11-20: qty 6

## 2013-11-20 MED ORDER — MIDAZOLAM HCL 2 MG/2ML IJ SOLN
INTRAMUSCULAR | Status: AC | PRN
  Administered 2013-11-20: 1 mg via INTRAVENOUS
  Administered 2013-11-20: 0.5 mg via INTRAVENOUS
  Administered 2013-11-20 (×3): 1 mg via INTRAVENOUS
  Administered 2013-11-20: 0.5 mg via INTRAVENOUS

## 2013-11-20 MED ORDER — SODIUM CHLORIDE 0.9 % IV SOLN
INTRAVENOUS | Status: DC
  Administered 2013-11-20: 10:00:00 via INTRAVENOUS

## 2013-11-20 NOTE — Discharge Instructions (Signed)
Moderate Sedation, Adult °Moderate sedation is given to help you relax or even sleep through a procedure. You may remain sleepy, be clumsy, or have poor balance for several hours following this procedure. Arrange for a responsible adult, family member, or friend to take you home. A responsible adult should stay with you for at least 24 hours or until the medicines have worn off. °· Do not participate in any activities where you could become injured for the next 24 hours, or until you feel normal again. Do not: °· Drive. °· Swim. °· Ride a bicycle. °· Operate heavy machinery. °· Cook. °· Use power tools. °· Climb ladders. °· Work at heights. °· Do not make important decisions or sign legal documents until you are improved. °· Vomiting may occur if you eat too soon. When you can drink without vomiting, try water, juice, or soup. Try solid foods if you feel little or no nausea. °· Only take over-the-counter or prescription medications for pain, discomfort, or fever as directed by your caregiver.If pain medications have been prescribed for you, ask your caregiver how soon it is safe to take them. °· Make sure you and your family fully understands everything about the medication given to you. Make sure you understand what side effects may occur. °· You should not drink alcohol, take sleeping pills, or medications that cause drowsiness for at least 24 hours. °· If you smoke, do not smoke alone. °· If you are feeling better, you may resume normal activities 24 hours after receiving sedation. °· Keep all appointments as scheduled. Follow all instructions. °· Ask questions if you do not understand. °SEEK MEDICAL CARE IF:  °· Your skin is pale or bluish in color. °· You continue to feel sick to your stomach (nauseous) or throw up (vomit). °· Your pain is getting worse and not helped by medication. °· You have bleeding or swelling. °· You are still sleepy or feeling clumsy after 24 hours. °SEEK IMMEDIATE MEDICAL CARE IF:   °· You develop a rash. °· You have difficulty breathing. °· You develop any type of allergic problem. °· You have a fever. °Document Released: 06/16/2001 Document Revised: 12/14/2011 Document Reviewed: 05/29/2013 °Exit[employer]® Patient Information ©2014 [employer]. °Implanted Port Home Guide °An implanted port is a type of central line that is placed under the skin. Central lines are used to provide IV access when treatment or nutrition needs to be given through a person's veins. Implanted ports are used for long-term IV access. An implanted port may be placed because:  °· You need IV medicine that would be irritating to the small veins in your hands or arms.   °· You need long-term IV medicines, such as antibiotics.   °· You need IV nutrition for a long period.   °· You need frequent blood draws for lab tests.   °· You need dialysis.   °Implanted ports are usually placed in the chest area, but they can also be placed in the upper arm, the abdomen, or the leg. An implanted port has two main parts:  °· Reservoir. The reservoir is round and will appear as a small, raised area under your skin. The reservoir is the part where a needle is inserted to give medicines or draw blood.   °· Catheter. The catheter is a thin, flexible tube that extends from the reservoir. The catheter is placed into a large vein. Medicine that is inserted into the reservoir goes into the catheter and then into the vein.   °HOW WILL I CARE FOR MY   INCISION SITE? °Do not get the incision site wet. Bathe or shower as directed by your health care provider.  °HOW IS MY PORT ACCESSED? °Special steps must be taken to access the port:  °· Before the port is accessed, a numbing cream can be placed on the skin. This helps numb the skin over the port site.   °· Your health care provider uses a sterile technique to access the port. °· Your health care provider must put on a mask and sterile gloves. °· The skin over your port is cleaned carefully with an  antiseptic and allowed to dry. °· The port is gently pinched between sterile gloves, and a needle is inserted into the port. °· Only "non-coring" port needles should be used to access the port. Once the port is accessed, a blood return should be checked. This helps ensure that the port is in the vein and is not clogged.   °· If your port needs to remain accessed for a constant infusion, a clear (transparent) bandage will be placed over the needle site. The bandage and needle will need to be changed every week, or as directed by your health care provider.   °· Keep the bandage covering the needle clean and dry. Do not get it wet. Follow your health care provider's instructions on how to take a shower or bath while the port is accessed.   °· If your port does not need to stay accessed, no bandage is needed over the port.   °WHAT IS FLUSHING? °Flushing helps keep the port from getting clogged. Follow your health care provider's instructions on how and when to flush the port. Ports are usually flushed with saline solution or a medicine called heparin. The need for flushing will depend on how the port is used.  °· If the port is used for intermittent medicines or blood draws, the port will need to be flushed:   °· After medicines have been given.   °· After blood has been drawn.   °· As part of routine maintenance.   °· If a constant infusion is running, the port may not need to be flushed.   °HOW LONG WILL MY PORT STAY IMPLANTED? °The port can stay in for as long as your health care provider thinks it is needed. When it is time for the port to come out, surgery will be done to remove it. The procedure is similar to the one performed when the port was put in.  °WHEN SHOULD I SEEK IMMEDIATE MEDICAL CARE? °When you have an implanted port, you should seek immediate medical care if:  °· You notice a bad smell coming from the incision site.   °· You have swelling, redness, or drainage at the incision site.   °· You have more  swelling or pain at the port site or the surrounding area.   °· You have a fever that is not controlled with medicine. °Document Released: 09/21/2005 Document Revised: 07/12/2013 Document Reviewed: 05/29/2013 °ExitCare® Patient Information ©2014 [employer]. ° °

## 2013-11-20 NOTE — Procedures (Signed)
Successful placement of right IJ approach port-a-cath with tip at the superior caval atrial junction. The catheter is ready for immediate use. No immediate post procedural complications. 

## 2013-11-20 NOTE — H&P (Signed)
Chief Complaint: "I'm here for a port" Referring Physician:Shadad HPI: Christine Gould is an 58 y.o. female with pancreatic cancer. She is set to begin chemotherapy tomorrow and is schedule for portacath placement today. PMHx and meds reviewed. She has been feeling well. No recent fever, chills, illness.  Past Medical History:  Past Medical History  Diagnosis Date  . Hypertension   . Depression   . Diabetes mellitus without complication   . Cancer     Dx. 11-02-13 Pancreatic Cancer    Past Surgical History:  Past Surgical History  Procedure Laterality Date  . Appendectomy    . Tubal ligation    . Cholecystectomy      open 70's  . Eus N/A 11/07/2013    Procedure: ESOPHAGEAL ENDOSCOPIC ULTRASOUND (EUS) RADIAL;  Surgeon: Arta Silence, MD;  Location: [facility];  Service: Endoscopy;  Laterality: N/A;  . Fine needle aspiration N/A 11/07/2013    Procedure: FINE NEEDLE ASPIRATION (FNA) LINEAR;  Surgeon: Arta Silence, MD;  Location: [facility];  Service: Endoscopy;  Laterality: N/A;    Family History: History reviewed. No pertinent family history.  Social History:  reports that she has been smoking Cigarettes.  She has a 17.5 pack-year smoking history. She does not have any smokeless tobacco history on file. She reports that she drinks alcohol. She reports that she does not use illicit drugs.  Allergies:  Allergies  Allergen Reactions  . Statins Other (See Comments)    Cramps-fatigue  . Toprol Xl [Metoprolol] Other (See Comments)    Pain     Medications:   Medication List    ASK your doctor about these medications       acetaminophen 500 MG tablet  Commonly known as:  TYLENOL  Take 1,000 mg by mouth every 6 (six) hours as needed for mild pain or moderate pain.     amLODipine 10 MG tablet  Commonly known as:  NORVASC  Take 10 mg by mouth every morning.     citalopram 20 MG tablet  Commonly known as:  CELEXA  Take 20 mg by mouth every morning.     glyBURIDE 5 MG  tablet  Commonly known as:  DIABETA  Take 5 mg by mouth daily with breakfast.     hydrochlorothiazide 25 MG tablet  Commonly known as:  HYDRODIURIL  Take 25 mg by mouth every morning.     HYDROcodone-acetaminophen 5-325 MG per tablet  Commonly known as:  NORCO/VICODIN  Take 1 tablet by mouth every 6 (six) hours as needed for moderate pain.     ibuprofen 200 MG tablet  Commonly known as:  ADVIL,MOTRIN  Take 400 mg by mouth every 6 (six) hours as needed for mild pain or moderate pain.     lidocaine-prilocaine cream  Commonly known as:  EMLA  Apply 1 application topically daily as needed (port).     metFORMIN 1000 MG tablet  Commonly known as:  GLUCOPHAGE  Take 1,000 mg by mouth 2 (two) times daily with a meal.     multivitamin with minerals Tabs tablet  Take 1 tablet by mouth daily.     olmesartan-hydrochlorothiazide 40-25 MG per tablet  Commonly known as:  BENICAR HCT  Take 1 tablet by mouth every morning.     oxyCODONE 5 MG immediate release tablet  Commonly known as:  Oxy IR/ROXICODONE  Take 5 mg by mouth every 4 (four) hours as needed for severe pain.     prochlorperazine 10 MG tablet  Commonly known  as:  COMPAZINE  Take 10 mg by mouth every 6 (six) hours as needed for nausea or vomiting.     traZODone 100 MG tablet  Commonly known as:  DESYREL  Take 100 mg by mouth at bedtime.        Please HPI for pertinent positives, otherwise complete 10 system ROS negative.  Physical Exam: BP 138/93  Pulse 103  Temp(Src) 98.7 F (37.1 C) (Oral)  Resp 18  SpO2 100% There is no weight on file to calculate BMI.   General Appearance:  Alert, cooperative, no distress, appears stated age  Head:  Normocephalic, without obvious abnormality, atraumatic  ENT: Unremarkable  Neck: Supple, symmetrical, trachea midline  Lungs:   Clear to auscultation bilaterally, no w/r/r, respirations unlabored without use of accessory muscles.  Chest Wall:  No tenderness or deformity  Heart:   Regular rate and rhythm, S1, S2 normal, no murmur, rub or gallop.  Neurologic: Normal affect, no gross deficits.   Results for orders placed during the hospital encounter of 11/20/13 (from the past 48 hour(s))  CBC     Status: None   Collection Time    11/20/13  9:50 AM      Result Value Ref Range   WBC 6.0  4.0 - 10.5 K/uL   RBC 4.73  3.87 - 5.11 MIL/uL   Hemoglobin 14.9  12.0 - 15.0 g/dL   HCT 42.4  36.0 - 46.0 %   MCV 89.6  78.0 - 100.0 fL   MCH 31.5  26.0 - 34.0 pg   MCHC 35.1  30.0 - 36.0 g/dL   RDW 12.5  11.5 - 15.5 %   Platelets 172  150 - 400 K/uL   No results found.  Assessment/Plan Pancreatic cancer For Port placement Explained procedure, risks, complications, use of sedation. Labs pending Husband at bedside today. Consent signed in chart  Ascencion Dike PA-C 11/20/2013, 10:34 AM

## 2013-11-21 ENCOUNTER — Other Ambulatory Visit (HOSPITAL_BASED_OUTPATIENT_CLINIC_OR_DEPARTMENT_OTHER): Payer: PRIVATE HEALTH INSURANCE

## 2013-11-21 ENCOUNTER — Ambulatory Visit (HOSPITAL_BASED_OUTPATIENT_CLINIC_OR_DEPARTMENT_OTHER): Payer: PRIVATE HEALTH INSURANCE

## 2013-11-21 VITALS — BP 126/67 | HR 84 | Temp 98.1°F | Resp 16

## 2013-11-21 DIAGNOSIS — Z5111 Encounter for antineoplastic chemotherapy: Secondary | ICD-10-CM

## 2013-11-21 DIAGNOSIS — C786 Secondary malignant neoplasm of retroperitoneum and peritoneum: Secondary | ICD-10-CM

## 2013-11-21 DIAGNOSIS — C259 Malignant neoplasm of pancreas, unspecified: Secondary | ICD-10-CM

## 2013-11-21 DIAGNOSIS — C252 Malignant neoplasm of tail of pancreas: Secondary | ICD-10-CM

## 2013-11-21 DIAGNOSIS — C787 Secondary malignant neoplasm of liver and intrahepatic bile duct: Secondary | ICD-10-CM

## 2013-11-21 LAB — COMPREHENSIVE METABOLIC PANEL (CC13)
ALT: 26 U/L (ref 0–55)
AST: 27 U/L (ref 5–34)
Albumin: 3.2 g/dL — ABNORMAL LOW (ref 3.5–5.0)
Alkaline Phosphatase: 132 U/L (ref 40–150)
Anion Gap: 13 mEq/L — ABNORMAL HIGH (ref 3–11)
BILIRUBIN TOTAL: 0.57 mg/dL (ref 0.20–1.20)
BUN: 9.4 mg/dL (ref 7.0–26.0)
CO2: 27 mEq/L (ref 22–29)
CREATININE: 0.6 mg/dL (ref 0.6–1.1)
Calcium: 9.2 mg/dL (ref 8.4–10.4)
Chloride: 97 mEq/L — ABNORMAL LOW (ref 98–109)
Glucose: 229 mg/dl — ABNORMAL HIGH (ref 70–140)
Potassium: 3.9 mEq/L (ref 3.5–5.1)
Sodium: 137 mEq/L (ref 136–145)
Total Protein: 5.6 g/dL — ABNORMAL LOW (ref 6.4–8.3)

## 2013-11-21 LAB — CBC WITH DIFFERENTIAL/PLATELET
BASO%: 0.3 % (ref 0.0–2.0)
Basophils Absolute: 0 10*3/uL (ref 0.0–0.1)
EOS ABS: 0 10*3/uL (ref 0.0–0.5)
EOS%: 0.5 % (ref 0.0–7.0)
HCT: 40.7 % (ref 34.8–46.6)
HGB: 13.9 g/dL (ref 11.6–15.9)
LYMPH#: 1.3 10*3/uL (ref 0.9–3.3)
LYMPH%: 21.2 % (ref 14.0–49.7)
MCH: 31 pg (ref 25.1–34.0)
MCHC: 34.2 g/dL (ref 31.5–36.0)
MCV: 90.8 fL (ref 79.5–101.0)
MONO#: 0.8 10*3/uL (ref 0.1–0.9)
MONO%: 13.1 % (ref 0.0–14.0)
NEUT%: 64.9 % (ref 38.4–76.8)
NEUTROS ABS: 4.1 10*3/uL (ref 1.5–6.5)
NRBC: 0 % (ref 0–0)
Platelets: 166 10*3/uL (ref 145–400)
RBC: 4.48 10*6/uL (ref 3.70–5.45)
RDW: 12.7 % (ref 11.2–14.5)
WBC: 6.3 10*3/uL (ref 3.9–10.3)

## 2013-11-21 MED ORDER — DEXAMETHASONE SODIUM PHOSPHATE 10 MG/ML IJ SOLN
INTRAMUSCULAR | Status: AC
  Filled 2013-11-21: qty 1

## 2013-11-21 MED ORDER — ONDANSETRON 8 MG/NS 50 ML IVPB
INTRAVENOUS | Status: AC
  Filled 2013-11-21: qty 8

## 2013-11-21 MED ORDER — DEXAMETHASONE SODIUM PHOSPHATE 10 MG/ML IJ SOLN
10.0000 mg | Freq: Once | INTRAMUSCULAR | Status: AC
  Administered 2013-11-21: 10 mg via INTRAVENOUS

## 2013-11-21 MED ORDER — HEPARIN SOD (PORK) LOCK FLUSH 100 UNIT/ML IV SOLN
500.0000 [IU] | Freq: Once | INTRAVENOUS | Status: AC | PRN
  Administered 2013-11-21: 500 [IU]
  Filled 2013-11-21: qty 5

## 2013-11-21 MED ORDER — SODIUM CHLORIDE 0.9 % IV SOLN
Freq: Once | INTRAVENOUS | Status: AC
  Administered 2013-11-21: 14:00:00 via INTRAVENOUS

## 2013-11-21 MED ORDER — PACLITAXEL PROTEIN-BOUND CHEMO INJECTION 100 MG
125.0000 mg/m2 | Freq: Once | INTRAVENOUS | Status: AC
  Administered 2013-11-21: 225 mg via INTRAVENOUS
  Filled 2013-11-21: qty 45

## 2013-11-21 MED ORDER — ONDANSETRON 8 MG/50ML IVPB (CHCC)
8.0000 mg | Freq: Once | INTRAVENOUS | Status: AC
  Administered 2013-11-21: 8 mg via INTRAVENOUS

## 2013-11-21 MED ORDER — SODIUM CHLORIDE 0.9 % IV SOLN
1000.0000 mg/m2 | Freq: Once | INTRAVENOUS | Status: AC
  Administered 2013-11-21: 1862 mg via INTRAVENOUS
  Filled 2013-11-21: qty 48.97

## 2013-11-21 MED ORDER — SODIUM CHLORIDE 0.9 % IJ SOLN
10.0000 mL | INTRAMUSCULAR | Status: DC | PRN
  Administered 2013-11-21: 10 mL
  Filled 2013-11-21: qty 10

## 2013-11-21 NOTE — Patient Instructions (Signed)
[facility] Discharge Instructions for Patients Receiving Chemotherapy  Today you received the following chemotherapy agents: Abraxane, Gemzar   To help prevent nausea and vomiting after your treatment, we encourage you to take your nausea medication as prescribed.    If you develop nausea and vomiting that is not controlled by your nausea medication, call the clinic.   BELOW ARE SYMPTOMS THAT SHOULD BE REPORTED IMMEDIATELY:  *FEVER GREATER THAN 100.5 F  *CHILLS WITH OR WITHOUT FEVER  NAUSEA AND VOMITING THAT IS NOT CONTROLLED WITH YOUR NAUSEA MEDICATION  *UNUSUAL SHORTNESS OF BREATH  *UNUSUAL BRUISING OR BLEEDING  TENDERNESS IN MOUTH AND THROAT WITH OR WITHOUT PRESENCE OF ULCERS  *URINARY PROBLEMS  *BOWEL PROBLEMS  UNUSUAL RASH Items with * indicate a potential emergency and should be followed up as soon as possible.  Feel free to call the clinic you have any questions or concerns. The clinic phone number is [phone].    

## 2013-11-22 ENCOUNTER — Telehealth: Payer: Self-pay | Admitting: *Deleted

## 2013-11-22 NOTE — Telephone Encounter (Signed)
Denies any adverse effect from chemo. "I'm doing fine".

## 2013-11-28 ENCOUNTER — Ambulatory Visit (HOSPITAL_BASED_OUTPATIENT_CLINIC_OR_DEPARTMENT_OTHER): Payer: PRIVATE HEALTH INSURANCE | Admitting: Oncology

## 2013-11-28 ENCOUNTER — Telehealth: Payer: Self-pay | Admitting: Oncology

## 2013-11-28 ENCOUNTER — Ambulatory Visit (HOSPITAL_BASED_OUTPATIENT_CLINIC_OR_DEPARTMENT_OTHER): Payer: PRIVATE HEALTH INSURANCE

## 2013-11-28 ENCOUNTER — Other Ambulatory Visit (HOSPITAL_BASED_OUTPATIENT_CLINIC_OR_DEPARTMENT_OTHER): Payer: PRIVATE HEALTH INSURANCE

## 2013-11-28 ENCOUNTER — Encounter: Payer: Self-pay | Admitting: Oncology

## 2013-11-28 VITALS — BP 109/64 | HR 93 | Temp 98.1°F | Resp 16 | Wt 159.2 lb

## 2013-11-28 DIAGNOSIS — C259 Malignant neoplasm of pancreas, unspecified: Secondary | ICD-10-CM

## 2013-11-28 DIAGNOSIS — D696 Thrombocytopenia, unspecified: Secondary | ICD-10-CM

## 2013-11-28 DIAGNOSIS — C787 Secondary malignant neoplasm of liver and intrahepatic bile duct: Secondary | ICD-10-CM

## 2013-11-28 DIAGNOSIS — C786 Secondary malignant neoplasm of retroperitoneum and peritoneum: Secondary | ICD-10-CM

## 2013-11-28 DIAGNOSIS — C252 Malignant neoplasm of tail of pancreas: Secondary | ICD-10-CM

## 2013-11-28 DIAGNOSIS — R109 Unspecified abdominal pain: Secondary | ICD-10-CM

## 2013-11-28 DIAGNOSIS — Z5111 Encounter for antineoplastic chemotherapy: Secondary | ICD-10-CM

## 2013-11-28 LAB — COMPREHENSIVE METABOLIC PANEL (CC13)
ALT: 145 U/L — ABNORMAL HIGH (ref 0–55)
ANION GAP: 12 meq/L — AB (ref 3–11)
AST: 92 U/L — ABNORMAL HIGH (ref 5–34)
Albumin: 2.8 g/dL — ABNORMAL LOW (ref 3.5–5.0)
Alkaline Phosphatase: 104 U/L (ref 40–150)
BILIRUBIN TOTAL: 0.36 mg/dL (ref 0.20–1.20)
BUN: 7.6 mg/dL (ref 7.0–26.0)
CO2: 28 meq/L (ref 22–29)
Calcium: 9.4 mg/dL (ref 8.4–10.4)
Chloride: 95 mEq/L — ABNORMAL LOW (ref 98–109)
Creatinine: 0.7 mg/dL (ref 0.6–1.1)
GLUCOSE: 309 mg/dL — AB (ref 70–140)
Potassium: 4 mEq/L (ref 3.5–5.1)
Sodium: 134 mEq/L — ABNORMAL LOW (ref 136–145)
TOTAL PROTEIN: 6.2 g/dL — AB (ref 6.4–8.3)

## 2013-11-28 LAB — CBC WITH DIFFERENTIAL/PLATELET
BASO%: 0.3 % (ref 0.0–2.0)
BASOS ABS: 0 10*3/uL (ref 0.0–0.1)
EOS ABS: 0 10*3/uL (ref 0.0–0.5)
EOS%: 0.3 % (ref 0.0–7.0)
HEMATOCRIT: 37.3 % (ref 34.8–46.6)
HEMOGLOBIN: 12.7 g/dL (ref 11.6–15.9)
LYMPH%: 31 % (ref 14.0–49.7)
MCH: 30.3 pg (ref 25.1–34.0)
MCHC: 34 g/dL (ref 31.5–36.0)
MCV: 89 fL (ref 79.5–101.0)
MONO#: 0.4 10*3/uL (ref 0.1–0.9)
MONO%: 11.1 % (ref 0.0–14.0)
NEUT%: 57.3 % (ref 38.4–76.8)
NEUTROS ABS: 2.2 10*3/uL (ref 1.5–6.5)
PLATELETS: 79 10*3/uL — AB (ref 145–400)
RBC: 4.19 10*6/uL (ref 3.70–5.45)
RDW: 12.6 % (ref 11.2–14.5)
WBC: 3.8 10*3/uL — ABNORMAL LOW (ref 3.9–10.3)
lymph#: 1.2 10*3/uL (ref 0.9–3.3)
nRBC: 0 % (ref 0–0)

## 2013-11-28 MED ORDER — ONDANSETRON 8 MG/NS 50 ML IVPB
INTRAVENOUS | Status: AC
  Filled 2013-11-28: qty 8

## 2013-11-28 MED ORDER — HEPARIN SOD (PORK) LOCK FLUSH 100 UNIT/ML IV SOLN
500.0000 [IU] | Freq: Once | INTRAVENOUS | Status: AC | PRN
  Administered 2013-11-28: 500 [IU]
  Filled 2013-11-28: qty 5

## 2013-11-28 MED ORDER — ONDANSETRON 8 MG/50ML IVPB (CHCC)
8.0000 mg | Freq: Once | INTRAVENOUS | Status: AC
  Administered 2013-11-28: 8 mg via INTRAVENOUS

## 2013-11-28 MED ORDER — DEXAMETHASONE SODIUM PHOSPHATE 10 MG/ML IJ SOLN
INTRAMUSCULAR | Status: AC
  Filled 2013-11-28: qty 1

## 2013-11-28 MED ORDER — SODIUM CHLORIDE 0.9 % IJ SOLN
10.0000 mL | INTRAMUSCULAR | Status: DC | PRN
  Administered 2013-11-28: 10 mL
  Filled 2013-11-28: qty 10

## 2013-11-28 MED ORDER — SODIUM CHLORIDE 0.9 % IV SOLN
Freq: Once | INTRAVENOUS | Status: AC
  Administered 2013-11-28: 11:00:00 via INTRAVENOUS

## 2013-11-28 MED ORDER — PACLITAXEL PROTEIN-BOUND CHEMO INJECTION 100 MG
125.0000 mg/m2 | Freq: Once | INTRAVENOUS | Status: AC
  Administered 2013-11-28: 225 mg via INTRAVENOUS
  Filled 2013-11-28: qty 45

## 2013-11-28 MED ORDER — DEXAMETHASONE SODIUM PHOSPHATE 10 MG/ML IJ SOLN
10.0000 mg | Freq: Once | INTRAMUSCULAR | Status: AC
  Administered 2013-11-28: 10 mg via INTRAVENOUS

## 2013-11-28 MED ORDER — SODIUM CHLORIDE 0.9 % IV SOLN
1000.0000 mg/m2 | Freq: Once | INTRAVENOUS | Status: AC
  Administered 2013-11-28: 1862 mg via INTRAVENOUS
  Filled 2013-11-28: qty 48.97

## 2013-11-28 NOTE — Patient Instructions (Signed)
[facility] Discharge Instructions for Patients Receiving Chemotherapy  Today you received the following chemotherapy agents Abraxane and Gemzar.  To help prevent nausea and vomiting after your treatment, we encourage you to take your nausea medication.   If you develop nausea and vomiting that is not controlled by your nausea medication, call the clinic.   BELOW ARE SYMPTOMS THAT SHOULD BE REPORTED IMMEDIATELY:  *FEVER GREATER THAN 100.5 F  *CHILLS WITH OR WITHOUT FEVER  NAUSEA AND VOMITING THAT IS NOT CONTROLLED WITH YOUR NAUSEA MEDICATION  *UNUSUAL SHORTNESS OF BREATH  *UNUSUAL BRUISING OR BLEEDING  TENDERNESS IN MOUTH AND THROAT WITH OR WITHOUT PRESENCE OF ULCERS  *URINARY PROBLEMS  *BOWEL PROBLEMS  UNUSUAL RASH Items with * indicate a potential emergency and should be followed up as soon as possible.  Feel free to call the clinic you have any questions or concerns. The clinic phone number is [phone].    

## 2013-11-28 NOTE — Progress Notes (Signed)
Per MD, Ok to treat today with current platelet counts.

## 2013-11-28 NOTE — Telephone Encounter (Signed)
gv pt appt schedule for march and april

## 2013-11-28 NOTE — Progress Notes (Signed)
Hematology and Oncology Follow Up Visit  Christine Gould [id number] 10-31-1955 58 y.o. 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 This was diagnosed in February of 2015. Her CA 19-9 was 37,431.  Prior Therapy: She is status post endoscopic ultrasound and a biopsy completed on 11/07/2013 confirmed the presence of adenocarcinoma.  Current therapy: She is receiving palliative chemotherapy in the form of Gemzar and Abraxane given on day 1, day 8 of 21 day cycle. Today is day 8 of cycle 1 of therapy.  Interim History:  Christine Gould presents today for a followup visit. She is accompanied by her daughter today prior to the administration of day 8 of cycle 1 of chemotherapy. She tolerated the last treatment without any complications. She had grade 1 diarrhea that lasted for 2-3 days and was resolved. She did not report any nausea or vomiting or abdominal pain. Did not report any hematochezia or melena. She is reporting better improvement in her abdominal distention and reporting her abdomen is more soft. She had a Port-A-Cath inserted that any complications. She is not reporting any peripheral neuropathy or decline in her performance status. She still reporting grade 1-2 fatigue  Medications: I have reviewed the patient's current medications.  Current Outpatient Prescriptions  Medication Sig Dispense Refill  . acetaminophen (TYLENOL) 500 MG tablet Take 1,000 mg by mouth every 6 (six) hours as needed for mild pain or moderate pain.      Marland Kitchen amLODipine (NORVASC) 10 MG tablet Take 10 mg by mouth every morning.      . citalopram (CELEXA) 20 MG tablet Take 20 mg by mouth every morning.      . glyBURIDE (DIABETA) 5 MG tablet Take 5 mg by mouth daily with breakfast.      .  hydrochlorothiazide (HYDRODIURIL) 25 MG tablet Take 25 mg by mouth every morning.       Marland Kitchen HYDROcodone-acetaminophen (NORCO/VICODIN) 5-325 MG per tablet Take 1 tablet by mouth every 6 (six) hours as needed for moderate pain.       Marland Kitchen ibuprofen (ADVIL,MOTRIN) 200 MG tablet Take 400 mg by mouth every 6 (six) hours as needed for mild pain or moderate pain.      Marland Kitchen lidocaine-prilocaine (EMLA) cream Apply 1 application topically daily as needed (port).      . metFORMIN (GLUCOPHAGE) 1000 MG tablet Take 1,000 mg by mouth 2 (two) times daily with a meal.      . Multiple Vitamin (MULTIVITAMIN WITH MINERALS) TABS tablet Take 1 tablet by mouth daily.      Marland Kitchen olmesartan-hydrochlorothiazide (BENICAR HCT) 40-25 MG per tablet Take 1 tablet by mouth every morning.      Marland Kitchen oxyCODONE (OXY IR/ROXICODONE) 5 MG immediate release tablet Take 5 mg by mouth every 4 (four) hours as needed for severe pain.      Marland Kitchen prochlorperazine (COMPAZINE) 10 MG tablet Take 10 mg by mouth every 6 (six) hours as needed for nausea or vomiting.      . traZODone (DESYREL) 100 MG tablet Take 100 mg by mouth at bedtime.       No current facility-administered medications for this visit.     Allergies:  Allergies  Allergen Reactions  . Statins Other (See Comments)    Cramps-fatigue  . Toprol Xl [Metoprolol] Other (See Comments)    Pain  Past Medical History, Surgical history, Social history, and Family History were reviewed and updated.  Review of Systems: Constitutional:  Negative for fever, chills, night sweats, anorexia, weight loss, pain. Cardiovascular: no chest pain or dyspnea on exertion Respiratory: no cough, shortness of breath, or wheezing Neurological: no TIA or stroke symptoms Dermatological: negative for pruritus, rash and skin lesion changes ENT: negative for - headaches, nasal discharge or sinus pain Skin: Negative. Gastrointestinal: Per history of present illness Genito-Urinary: no dysuria, trouble voiding, or  hematuria Hematological and Lymphatic: negative for - bleeding problems, blood clots or bruising Breast: negative Musculoskeletal: negative for - gait disturbance, joint swelling or muscle pain Remaining ROS negative. Physical Exam: Blood pressure 109/64, pulse 93, temperature 98.1 F (36.7 C), temperature source Oral, resp. rate 16, weight 159 lb 3 oz (72.207 kg). ECOG: 1 General appearance: alert, cooperative and appears stated age Head: Normocephalic, without obvious abnormality, atraumatic Neck: no adenopathy, no carotid bruit, no JVD, supple, symmetrical, trachea midline and thyroid not enlarged, symmetric, no tenderness/mass/nodules Lymph nodes: Cervical, supraclavicular, and axillary nodes normal. Heart:regular rate and rhythm, S1, S2 normal, no murmur, click, rub or gallop Lung:chest clear, no wheezing, rales, normal symmetric air entry, Heart exam - S1, S2 normal, no murmur, no gallop, rate regular Abdomin: soft, non-tender, without masses or organomegaly. I cannot appreciate any ascites or shifting dullness. EXT:no erythema, induration, or nodules   Lab Results: Lab Results  Component Value Date   WBC 3.8* 11/28/2013   HGB 12.7 11/28/2013   HCT 37.3 11/28/2013   MCV 89.0 11/28/2013   PLT 79* 11/28/2013     Chemistry      Component Value Date/Time   NA 137 11/21/2013 1258   K 3.9 11/21/2013 1258   CO2 27 11/21/2013 1258   BUN 9.4 11/21/2013 1258   CREATININE 0.6 11/21/2013 1258      Component Value Date/Time   CALCIUM 9.2 11/21/2013 1258   ALKPHOS 132 11/21/2013 1258   AST 27 11/21/2013 1258   ALT 26 11/21/2013 1258   BILITOT 0.57 11/21/2013 1258      Impression and Plan:   58 year old woman with the following issues:  1. 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 She is receiving palliative chemotherapy in the form of Gemzar and Abraxane. She tolerated day 1 of  this cycle without any complications. She is ready to proceed with day 8. I reemphasized the complications associated with this regimen including GI toxicity, peripheral neuropathy and myelosuppression. Cycle 2 will start on 12/12/2013.  2. Abdominal pain: She is currently utilizing hydrocodone with acetaminophen which actually has improved after the start of chemotherapy.  3. IV access: She has a Port-A-Cath in place and inserted without any complications. I have explained to her the complications associated with that including bleeding of thrombosis.  4. Nausea prophylaxis: She has antiemetics prescribed and appears to be what you well.  5.Thrombosis: I've explained to her the risks associated with this particular malignancy would increase the risk of thromboembolism. I will continue to educate her about that.  6. Thrombocytopenia: This is related to systemic chemotherapy. She has no bleeding complications today so we'll proceed as scheduled. And if her platelet counts is above 90,000 on day 1 of cycle 2 we will proceed as well.   7. Followup: On 12/12/2013 for day 1 of cycle 2.   [facility], MD 2/24/201510:27 AM

## 2013-12-04 ENCOUNTER — Encounter: Payer: Self-pay | Admitting: Oncology

## 2013-12-04 NOTE — Progress Notes (Signed)
FAXED 22 TO MELODY STAFFORD, RN @ [facility] [phone] HER PHONE [place] [phone]

## 2013-12-05 ENCOUNTER — Ambulatory Visit: Payer: PRIVATE HEALTH INSURANCE

## 2013-12-12 ENCOUNTER — Other Ambulatory Visit (HOSPITAL_BASED_OUTPATIENT_CLINIC_OR_DEPARTMENT_OTHER): Payer: PRIVATE HEALTH INSURANCE

## 2013-12-12 ENCOUNTER — Ambulatory Visit (HOSPITAL_BASED_OUTPATIENT_CLINIC_OR_DEPARTMENT_OTHER): Payer: PRIVATE HEALTH INSURANCE

## 2013-12-12 ENCOUNTER — Encounter: Payer: Self-pay | Admitting: Physician Assistant

## 2013-12-12 ENCOUNTER — Ambulatory Visit (HOSPITAL_BASED_OUTPATIENT_CLINIC_OR_DEPARTMENT_OTHER): Payer: PRIVATE HEALTH INSURANCE | Admitting: Physician Assistant

## 2013-12-12 ENCOUNTER — Encounter: Payer: Self-pay | Admitting: Oncology

## 2013-12-12 ENCOUNTER — Ambulatory Visit (HOSPITAL_COMMUNITY)
Admission: RE | Admit: 2013-12-12 | Discharge: 2013-12-12 | Disposition: A | Discharge status: Home / Self Care | Payer: PRIVATE HEALTH INSURANCE | Source: Ambulatory Visit | Attending: Physician Assistant | Admitting: Physician Assistant

## 2013-12-12 VITALS — BP 128/78 | HR 103 | Temp 98.1°F | Resp 18 | Ht 65.0 in | Wt 158.3 lb

## 2013-12-12 DIAGNOSIS — C786 Secondary malignant neoplasm of retroperitoneum and peritoneum: Secondary | ICD-10-CM

## 2013-12-12 DIAGNOSIS — C787 Secondary malignant neoplasm of liver and intrahepatic bile duct: Secondary | ICD-10-CM

## 2013-12-12 DIAGNOSIS — C259 Malignant neoplasm of pancreas, unspecified: Secondary | ICD-10-CM

## 2013-12-12 DIAGNOSIS — M79609 Pain in unspecified limb: Secondary | ICD-10-CM | POA: Insufficient documentation

## 2013-12-12 DIAGNOSIS — C252 Malignant neoplasm of tail of pancreas: Secondary | ICD-10-CM

## 2013-12-12 DIAGNOSIS — I824Z9 Acute embolism and thrombosis of unspecified deep veins of unspecified distal lower extremity: Secondary | ICD-10-CM

## 2013-12-12 DIAGNOSIS — M7989 Other specified soft tissue disorders: Secondary | ICD-10-CM

## 2013-12-12 DIAGNOSIS — Z5111 Encounter for antineoplastic chemotherapy: Secondary | ICD-10-CM

## 2013-12-12 DIAGNOSIS — R600 Localized edema: Secondary | ICD-10-CM

## 2013-12-12 DIAGNOSIS — R109 Unspecified abdominal pain: Secondary | ICD-10-CM

## 2013-12-12 DIAGNOSIS — I82409 Acute embolism and thrombosis of unspecified deep veins of unspecified lower extremity: Secondary | ICD-10-CM

## 2013-12-12 LAB — COMPREHENSIVE METABOLIC PANEL (CC13)
ALK PHOS: 161 U/L — AB (ref 40–150)
ALT: 79 U/L — ABNORMAL HIGH (ref 0–55)
AST: 22 U/L (ref 5–34)
Albumin: 2.8 g/dL — ABNORMAL LOW (ref 3.5–5.0)
Anion Gap: 8 mEq/L (ref 3–11)
BUN: 5.8 mg/dL — AB (ref 7.0–26.0)
CO2: 27 mEq/L (ref 22–29)
CREATININE: 0.7 mg/dL (ref 0.6–1.1)
Calcium: 9.2 mg/dL (ref 8.4–10.4)
Chloride: 97 mEq/L — ABNORMAL LOW (ref 98–109)
GLUCOSE: 391 mg/dL — AB (ref 70–140)
Potassium: 4.9 mEq/L (ref 3.5–5.1)
Sodium: 133 mEq/L — ABNORMAL LOW (ref 136–145)
Total Bilirubin: 0.47 mg/dL (ref 0.20–1.20)
Total Protein: 6 g/dL — ABNORMAL LOW (ref 6.4–8.3)

## 2013-12-12 LAB — CBC WITH DIFFERENTIAL/PLATELET
BASO%: 0.5 % (ref 0.0–2.0)
BASOS ABS: 0 10*3/uL (ref 0.0–0.1)
EOS ABS: 0.1 10*3/uL (ref 0.0–0.5)
EOS%: 1.9 % (ref 0.0–7.0)
HCT: 36.3 % (ref 34.8–46.6)
HGB: 11.8 g/dL (ref 11.6–15.9)
LYMPH%: 23.3 % (ref 14.0–49.7)
MCH: 30.2 pg (ref 25.1–34.0)
MCHC: 32.5 g/dL (ref 31.5–36.0)
MCV: 93.1 fL (ref 79.5–101.0)
MONO#: 0.8 10*3/uL (ref 0.1–0.9)
MONO%: 16.1 % — ABNORMAL HIGH (ref 0.0–14.0)
NEUT%: 58.2 % (ref 38.4–76.8)
NEUTROS ABS: 3 10*3/uL (ref 1.5–6.5)
PLATELETS: 266 10*3/uL (ref 145–400)
RBC: 3.9 10*6/uL (ref 3.70–5.45)
RDW: 14.4 % (ref 11.2–14.5)
WBC: 5.2 10*3/uL (ref 3.9–10.3)
lymph#: 1.2 10*3/uL (ref 0.9–3.3)

## 2013-12-12 MED ORDER — SODIUM CHLORIDE 0.9 % IJ SOLN
10.0000 mL | INTRAMUSCULAR | Status: DC | PRN
  Filled 2013-12-12: qty 10

## 2013-12-12 MED ORDER — DEXAMETHASONE SODIUM PHOSPHATE 10 MG/ML IJ SOLN
INTRAMUSCULAR | Status: AC
  Filled 2013-12-12: qty 1

## 2013-12-12 MED ORDER — ONDANSETRON 8 MG/50ML IVPB (CHCC)
8.0000 mg | Freq: Once | INTRAVENOUS | Status: AC
  Administered 2013-12-12: 8 mg via INTRAVENOUS

## 2013-12-12 MED ORDER — SODIUM CHLORIDE 0.9 % IV SOLN
1000.0000 mg/m2 | Freq: Once | INTRAVENOUS | Status: AC
  Administered 2013-12-12: 1862 mg via INTRAVENOUS
  Filled 2013-12-12: qty 49

## 2013-12-12 MED ORDER — OXYCODONE HCL 5 MG PO TABS: 5.0000 mg | ORAL_TABLET | ORAL | Status: DC | PRN

## 2013-12-12 MED ORDER — ONDANSETRON 8 MG/NS 50 ML IVPB
INTRAVENOUS | Status: AC
  Filled 2013-12-12: qty 8

## 2013-12-12 MED ORDER — SODIUM CHLORIDE 0.9 % IV SOLN
Freq: Once | INTRAVENOUS | Status: AC
  Administered 2013-12-12: 14:00:00 via INTRAVENOUS

## 2013-12-12 MED ORDER — HEPARIN SOD (PORK) LOCK FLUSH 100 UNIT/ML IV SOLN
500.0000 [IU] | Freq: Once | INTRAVENOUS | Status: DC | PRN
  Filled 2013-12-12: qty 5

## 2013-12-12 MED ORDER — ENOXAPARIN SODIUM 120 MG/0.8ML ~~LOC~~ SOLN
110.0000 mg | Freq: Once | SUBCUTANEOUS | Status: AC
  Administered 2013-12-12: 110 mg via SUBCUTANEOUS
  Filled 2013-12-12: qty 0.8

## 2013-12-12 MED ORDER — PACLITAXEL PROTEIN-BOUND CHEMO INJECTION 100 MG
125.0000 mg/m2 | Freq: Once | INTRAVENOUS | Status: AC
  Administered 2013-12-12: 225 mg via INTRAVENOUS
  Filled 2013-12-12: qty 45

## 2013-12-12 MED ORDER — DEXAMETHASONE SODIUM PHOSPHATE 10 MG/ML IJ SOLN
10.0000 mg | Freq: Once | INTRAMUSCULAR | Status: AC
  Administered 2013-12-12: 10 mg via INTRAVENOUS

## 2013-12-12 NOTE — Progress Notes (Signed)
Put husband's fmla form on nurse's desk. °

## 2013-12-12 NOTE — Progress Notes (Signed)
Hematology and Oncology Follow Up Visit  Christine Gould [id number] 1956/02/10 58 y.o. 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 This was diagnosed in February of 2015. Her CA 19-9 was 37,[id number].  Prior Therapy: She is status post endoscopic ultrasound and a biopsy completed on 11/07/2013 confirmed the presence of adenocarcinoma.  Current therapy: She is receiving palliative chemotherapy in the form of Gemzar and Abraxane given on day 1, day 8 of 21 day cycle. Status post 1 cycle. Today is day 1 of cycle 2 of therapy.  Interim History:  Christine Gould presents today for a followup visit. She is accompanied by her daughter today prior to the administration of day 1 of cycle 2 of chemotherapy. She tolerated the last treatment without any complications. She complains of awakening this morning with pain and swelling in her left leg. She denies any trauma.  She did not report any nausea or vomiting. She does have some occasional  abdominal pain that is well-controlled with her oxycodone tablets. She requests a refill for the oxycodone tablets. Denies hematochezia or melena.She denied any peripheral neuropathy or decline in her performance status. She continues to report grade 1-2 fatigue.  Medications: I have reviewed the patient's current medications.  Current Outpatient Prescriptions  Medication Sig Dispense Refill  . acetaminophen (TYLENOL) 500 MG tablet Take 1,000 mg by mouth every 6 (six) hours as needed for mild pain or moderate pain.      Marland Kitchen ALPRAZolam (XANAX) 0.5 MG tablet       . amLODipine (NORVASC) 10 MG tablet Take 10 mg by mouth every morning.      . citalopram (CELEXA) 20 MG tablet Take 20 mg by mouth every morning.      . glyBURIDE (DIABETA) 5 MG tablet Take 5 mg by  mouth daily with breakfast.      . hydrochlorothiazide (HYDRODIURIL) 25 MG tablet Take 25 mg by mouth every morning.       Marland Kitchen ibuprofen (ADVIL,MOTRIN) 200 MG tablet Take 400 mg by mouth every 6 (six) hours as needed for mild pain or moderate pain.      Marland Kitchen lidocaine-prilocaine (EMLA) cream Apply 1 application topically daily as needed (port).      . metFORMIN (GLUCOPHAGE) 1000 MG tablet Take 1,000 mg by mouth 2 (two) times daily with a meal.      . Multiple Vitamin (MULTIVITAMIN WITH MINERALS) TABS tablet Take 1 tablet by mouth daily.      Marland Kitchen olmesartan-hydrochlorothiazide (BENICAR HCT) 40-25 MG per tablet Take 1 tablet by mouth every morning.      Marland Kitchen oxyCODONE (OXY IR/ROXICODONE) 5 MG immediate release tablet Take 1 tablet (5 mg total) by mouth every 4 (four) hours as needed for severe pain.  30 tablet  0  . prochlorperazine (COMPAZINE) 10 MG tablet Take 10 mg by mouth every 6 (six) hours as needed for nausea or vomiting.      . traZODone (DESYREL) 100 MG tablet Take 100 mg by mouth at bedtime.      Marland Kitchen HYDROcodone-acetaminophen (NORCO/VICODIN) 5-325 MG per tablet Take 1 tablet by mouth every 6 (six) hours as needed for moderate pain.        Current Facility-Administered Medications  Medication Dose Route Frequency Provider Last Rate Last Dose  . enoxaparin (LOVENOX) injection 110 mg  110  mg Subcutaneous Once Carlton Adam, PA-C       Facility-Administered Medications Ordered in Other Visits  Medication Dose Route Frequency Provider Last Rate Last Dose  . Gemcitabine HCl (GEMZAR) 1,862 mg in sodium chloride 0.9 % 100 mL chemo infusion  1,000 mg/m2 (Treatment Plan Actual) Intravenous Once Wyatt Portela, MD      . heparin lock flush 100 unit/mL  500 Units Intracatheter Once PRN Wyatt Portela, MD      . PACLitaxel-protein bound (ABRAXANE) chemo infusion 225 mg  125 mg/m2 (Treatment Plan Actual) Intravenous Once Wyatt Portela, MD      . sodium chloride 0.9 % injection 10 mL  10 mL Intracatheter PRN  Wyatt Portela, MD         Allergies:  Allergies  Allergen Reactions  . Statins Other (See Comments)    Cramps-fatigue  . Toprol Xl [Metoprolol] Other (See Comments)    Pain     Past Medical History, Surgical history, Social history, and Family History were reviewed and updated.  Review of Systems: Constitutional:  Negative for fever, chills, night sweats, anorexia, weight loss, pain. Cardiovascular: no chest pain or dyspnea on exertion Respiratory: no cough, shortness of breath, or wheezing Neurological: no TIA or stroke symptoms Dermatological: negative for pruritus, rash and skin lesion changes ENT: negative for - headaches, nasal discharge or sinus pain Skin: Negative. Gastrointestinal: Per history of present illness Genito-Urinary: no dysuria, trouble voiding, or hematuria Hematological and Lymphatic: negative for - bleeding problems, blood clots or bruising Breast: negative Musculoskeletal: positive for - left lower extremity pain and swelling  Remaining ROS negative.  Physical Exam: Blood pressure 128/78, pulse 103, temperature 98.1 F (36.7 C), temperature source Oral, resp. rate 18, height 5\' 5"  (1.651 m), weight 158 lb 4.8 oz (71.804 kg), SpO2 99.00%.  ECOG: 1  General appearance: alert, cooperative and appears stated age Head: Normocephalic, without obvious abnormality, atraumatic Neck: no adenopathy, no carotid bruit, no JVD, supple, symmetrical, trachea midline and thyroid not enlarged, symmetric, no tenderness/mass/nodules Lymph nodes: Cervical, supraclavicular, and axillary nodes normal. Heart:regular rate and rhythm, S1, S2 normal, no murmur, click, rub or gallop Lung:chest clear, no wheezing, rales, normal symmetric air entry, Heart exam - S1, S2 normal, no murmur, no gallop, rate regular Abdomin: soft, non-tender, without masses or organomegaly.No appreciable  ascites or shifting dullness. EXT: 1+ - 2+ soft tissue edema left lower extremity with warmth  and tenderness but no erythema. Right lower extremity with trace to 1+ edema  Lab Results: Lab Results  Component Value Date   WBC 5.2 12/12/2013   HGB 11.8 12/12/2013   HCT 36.3 12/12/2013   MCV 93.1 12/12/2013   PLT 266 12/12/2013     Chemistry      Component Value Date/Time   NA 133* 12/12/2013 1151   K 4.9 12/12/2013 1151   CO2 27 12/12/2013 1151   BUN 5.8* 12/12/2013 1151   CREATININE 0.7 12/12/2013 1151      Component Value Date/Time   CALCIUM 9.2 12/12/2013 1151   ALKPHOS 161* 12/12/2013 1151   AST 22 12/12/2013 1151   ALT 79* 12/12/2013 1151   BILITOT 0.47 12/12/2013 1151      Impression and Plan:   58 year old woman with the following issues:  1. 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 She is receiving palliative chemotherapy in the form of Gemzar and Abraxane. She tolerated cycle 1 without  any complications. She is ready to proceed with day 1 of cycle #2.  2. Abdominal pain: She is currently utilizing oxycodone with good relief. Pain has improved after the start of chemotherapy.  3. IV access: She has a Port-A-Cath in place and inserted without any complications.  4. Nausea prophylaxis: She has antiemetics prescribed and appears to be what you well.  5.Thrombosis: Acute onset left lower extremity pain and swelling is concerning for DVT. Patient was sent for bilateral lower extremity Dopplers which was positive for a left lower extremity DVT. Patient was reviewed with Dr. Alen Blew. We will start her on Lovenox  At 110 mg subcutaneously daily (1.5 mg per kilogram based on her weight of 72 kg) infusion nurse will give the first dose and educate the patient on Lovenox injections. She was given a weeks worth of Lovenox syringes.   6. Thrombocytopenia: resolved. Will continue with treatment if her platelet count is above 90,000.  7. Followup: On 12/19/2013 for day 8 of  cycle 2.   Jood Retana E, PA-C  3/10/20152:30 PM

## 2013-12-12 NOTE — Patient Instructions (Signed)
[facility] Discharge Instructions for Patients Receiving Chemotherapy  Today you received the following chemotherapy agents Abraxane, Gemzar.    To help prevent nausea and vomiting after your treatment, we encourage you to take your nausea medication as prescribed.    If you develop nausea and vomiting that is not controlled by your nausea medication, call the clinic.   BELOW ARE SYMPTOMS THAT SHOULD BE REPORTED IMMEDIATELY:  *FEVER GREATER THAN 100.5 F  *CHILLS WITH OR WITHOUT FEVER  NAUSEA AND VOMITING THAT IS NOT CONTROLLED WITH YOUR NAUSEA MEDICATION  *UNUSUAL SHORTNESS OF BREATH  *UNUSUAL BRUISING OR BLEEDING  TENDERNESS IN MOUTH AND THROAT WITH OR WITHOUT PRESENCE OF ULCERS  *URINARY PROBLEMS  *BOWEL PROBLEMS  UNUSUAL RASH Items with * indicate a potential emergency and should be followed up as soon as possible.  Feel free to call the clinic should you have any questions or concerns. The clinic phone number is [phone].  It was my pleasure to take care of you today!  Leeanne Rio, RN  Enoxaparin, Home Use Enoxaparin (Lovenox) injection is a medication used to prevent clots from developing in your veins. Medications such as enoxaparin are called blood thinners or anticoagulants. If blood clots are untreated they could travel to your lungs. This is called a pulmonary embolus. A blood clot in your lungs can be fatal. Caregivers often use anticoagulants such as enoxaparin to prevent clots following surgery. It is also used along with aspirin when the heart is not getting enough blood. Continue the enoxaparin injections as directed by your caregiver. Your caregiver will use blood clotting test results to decide when you can safely stop using enoxaparin injections. If your caregiver prescribes any additional anticoagulant, you must take it exactly as directed. RISKS AND COMPLICATIONS  If you have received recent epidural anesthesia, spinal anesthesia, or  a spinal tap while receiving anticoagulants, you are at risk for developing a blood clot in or around the spine. This condition could result in long-term or permanent paralysis.  Because anticoagulants thin your blood, severe bleeding may occur from any tissue or organ. Symptoms of the blood being too thin may include:  Bleeding from the nose or gums that does not stop quickly.  Unusual bruising or bruising easily.  Swelling or pain at an injection site.  A cut that does not stop bleeding within 10 minutes.  Continual nausea for more than 1 day or vomiting blood.  Coughing up blood.  Blood in the urine which may appear as pink, red, or brown urine.  Blood in bowel movements which may appear as red, dark or black stools.  Sudden weakness or numbness of the face, arm, or leg, especially on one side of the body.  Sudden confusion.  Trouble speaking (aphasia) or understanding.  Sudden trouble seeing in one or both eyes.  Sudden trouble walking.  Dizziness.  Loss of balance or coordination.  Severe pain, such as a headache, joint pain, or back pain.  Fever.  Bruising around the injection sites may be expected.  Platelet drops, known as "thrombocytopenia," can occur with enoxaparin use. A condition called "heparin-induced thrombocytopenia" has been seen. If you have had this condition, you should tell your caregiver. Your caregiver may direct you to have blood tests to monitor this condition.  Do not use if you have allergies to the medication, heparin, or pork products.  Other side effects may include mild local reactions or irritation at the site of injection, pain, bruising, and redness of  skin. HOME CARE INSTRUCTIONS You will be instructed by your caregiver how to give enoxaparin injections. 1. Before giving your medication you should make sure the injection is a clear and colorless or pale yellow solution. If your medication becomes discolored or has particles in the  bottle, do not use and notify your caregiver. 2. When using the 30 and 40 mg pre-filled syringes, do not expel the air bubble from the syringe before the injection. This makes sure you use all the medication in the syringe. 3. The injections will be given subcutaneously. This means it is given into the fat over the belly (abdomen). It is given deep beneath the skin but not into the muscle. The shots should be injected around the abdominal wall. Change the sites of injection each time. The whole length of the needle should be introduced into a skin fold held between the thumb and forefinger; the skin fold should be held throughout the injection. Do not rub the injection site after completion of the injection. This increases bruising. Enoxaparin injection pre-filled syringes and graduated pre-filled syringes are available with a system that shields the needle after injection. 4. Inject by pushing the plunger to the bottom of the syringe. 5. Remove the syringe from the injection site keeping your finger on the plunger rod. Be careful not to stick yourself or others. 6. After injection and the syringe is empty, set off the safety system by firmly pushing the plunger rod. The protective sleeve will automatically cover the needle and you can hear a click. The click means your needle is safely covered. Do not try replacing the needle shield. 7. Get rid of the syringe in the nearest sharps container. 8. Keep your medication safely stored at room temperatures.  Due to the complications of anticoagulants, it is very important that you take your anticoagulant as directed by your caregiver. Anticoagulants need to be taken exactly as instructed. Be sure you understand all your anticoagulant instructions.  Changes in medicines, supplements, diet, and illness can affect your anticoagulation therapy. Be sure to inform your caregivers of any of these changes.  While on anticoagulants, you will need to have blood tests  done routinely as directed by your caregivers.  Be careful not to cut yourself when using sharp objects.  Limit physical activities or sports that could result in a fall or cause injury.  It is extremely important that you tell all of your caregivers and dentist that you are taking an anticoagulant, especially if you are injured or plan to have any type of procedure or operation.  Follow up with your laboratory test and caregiver appointments as directed. It is very important to keep your appointments. Not keeping appointments could result in a chronic or permanent injury, pain, or disability. SEEK MEDICAL CARE IF:  You develop any rashes.  You have any worsening of the condition for which you are receiving anticoagulation therapy. SEEK IMMEDIATE MEDICAL CARE IF:  Bleeding from the nose or gums does not stop quickly.  You have unusual bruising or are bruising easily.  Swelling or pain occurs at an injection site.  A cut does not stop bleeding within 10 minutes.  You have continual nausea for more than 1 day or are vomiting blood.  You are coughing up blood.  You have blood in the urine.  You have dark or black stools.  You have sudden weakness or numbness of the face, arm, or leg, especially on one side of the body.  You have sudden  confusion.  You have trouble speaking (aphasia) or understanding.  You have sudden trouble seeing in one or both eyes.  You have sudden trouble walking.  You have dizziness.  You have a loss of balance or coordination.  You have severe pain, such as a headache, joint pain, or back pain.  You have a serious fall or head injury, even if you are not bleeding.  You have an oral temperature above 102 F (38.9 C), not controlled by medicine. ANY OF THESE SYMPTOMS MAY REPRESENT A SERIOUS PROBLEM THAT IS AN EMERGENCY. Do not wait to see if the symptoms will go away. Get medical help right away. Call your local emergency services (911 in [place]  DO NOT drive yourself to the hospital. MAKE SURE YOU:  Understand these instructions.  Will watch your condition.  Will get help right away if you are not doing well or get worse. Document Released: 07/23/2004 Document Revised: 12/14/2011 Document Reviewed: 09/21/2005 [facility] Patient Information 2014 [place].  Deep Vein Thrombosis A deep vein thrombosis (DVT) is a blood clot that develops in the deep, larger veins of the leg, arm, or pelvis. These are more dangerous than clots that might form in veins near the surface of the body. A DVT can lead to complications if the clot breaks off and travels in the bloodstream to the lungs.  A DVT can damage the valves in your leg veins, so that instead of flowing upward, the blood pools in the lower leg. This is called post-thrombotic syndrome, and it can result in pain, swelling, discoloration, and sores on the leg. CAUSES Usually, several things contribute to blood clots forming. Contributing factors include:  The flow of blood slows down.  The inside of the vein is damaged in some way.  You have a condition that makes blood clot more easily. RISK FACTORS Some people are more likely than others to develop blood clots. Risk factors include:   Older age, especially over 106 years of age.  Having a family history of blood clots or if you have already had a blot clot.  Having major or lengthy surgery. This is especially true for surgery on the hip, knee, or belly (abdomen). Hip surgery is particularly high risk.  Breaking a hip or leg.  Sitting or lying still for a long time. This includes long-distance travel, paralysis, or recovery from an illness or surgery.  Having cancer or cancer treatment.  Having a long, thin tube (catheter) placed inside a vein during a medical procedure.  Being overweight (obese).  Pregnancy and childbirth.  Hormone changes make the blood clot more easily during pregnancy.  The fetus puts pressure on  the veins of the pelvis.  There is a risk of injury to veins during delivery or a caesarean. The risk is highest just after childbirth.  Medicines with the female hormone estrogen. This includes birth control pills and hormone replacement therapy.  Smoking.  Other circulation or heart problems.  SIGNS AND SYMPTOMS When a clot forms, it can either partially or totally block the blood flow in that vein. Symptoms of a DVT can include:  Swelling of the leg or arm, especially if one side is much worse.  Warmth and redness of the leg or arm, especially if one side is much worse.  Pain in an arm or leg. If the clot is in the leg, symptoms may be more noticeable or worse when standing or walking. The symptoms of a DVT that has traveled to the lungs (  pulmonary embolism, PE) usually start suddenly and include:  Shortness of breath.  Coughing.  Coughing up blood or blood-tinged phlegm.  Chest pain. The chest pain is often worse with deep breaths.  Rapid heartbeat. Anyone with these symptoms should get emergency medical treatment right away. Call your local emergency services (911 in the [place] if you have these symptoms. DIAGNOSIS If a DVT is suspected, your health care provider will take a full medical history and perform a physical exam. Tests that also may be required include:  Blood tests, including studies of the clotting properties of the blood.  Ultrasonography to see if you have clots in your legs or lungs.  X-rays to show the flow of blood when dye is injected into the veins (venography).  Studies of your lungs if you have any chest symptoms. PREVENTION  Exercise the legs regularly. Take a brisk 30-minute walk every day.  Maintain a weight that is appropriate for your height.  Avoid sitting or lying in bed for long periods of time without moving your legs.  Women, particularly those over the age of 65 years, should consider the risks and benefits of taking estrogen  medicines, including birth control pills.  Do not smoke, especially if you take estrogen medicines.  Long-distance travel can increase your risk of DVT. You should exercise your legs by walking or pumping the muscles every hour.  In-hospital prevention:  Many of the risk factors above relate to situations that exist with hospitalization, either for illness, injury, or elective surgery.  Your health care provider will assess you for the need for venous thromboembolism prophylaxis when you are admitted to the hospital. If you are having surgery, your surgeon will assess you the day of or day after surgery.  Prevention may include medical and nonmedical measures. TREATMENT Once identified, a DVT can be treated. It can also be prevented in some circumstances. Once you have had a DVT, you may be at increased risk for a DVT in the future. The most common treatment for DVT is blood thinning (anticoagulant) medicine, which reduces the blood's tendency to clot. Anticoagulants can stop new blood clots from forming and stop old ones from growing. They cannot dissolve existing clots. Your body does this by itself over time. Anticoagulants can be given by mouth, by IV access, or by injection. Your health care provider will determine the best program for you. Other medicines or treatments that may be used are:  Heparin or related medicines (low molecular weight heparin) are usually the first treatment for a blood clot. They act quickly. However, they cannot be taken orally.  Heparin can cause a fall in a component of blood that stops bleeding and forms blood clots (platelets). You will be monitored with blood tests to be sure this does not occur.  Warfarin is an anticoagulant that can be swallowed. It takes a few days to start working, so usually heparin or related medicines are used in combination. Once warfarin is working, heparin is usually stopped.  Less commonly, clot dissolving drugs (thrombolytics)  are used to dissolve a DVT. They carry a high risk of bleeding, so they are used mainly in severe cases, where your life or a limb is threatened.  Very rarely, a blood clot in the leg needs to be removed surgically.  If you are unable to take anticoagulants, your health care provider may arrange for you to have a filter placed in a main vein in your abdomen. This filter prevents clots from traveling to  your lungs. HOME CARE INSTRUCTIONS  Take all medicines prescribed by your health care provider. Only take over-the-counter or prescription medicines for pain, fever, or discomfort as directed by your health care provider.  Warfarin. Most people will continue taking warfarin after hospital discharge. Your health care provider will advise you on the length of treatment (usually 3 6 months, sometimes lifelong).  Too much and too little warfarin are both dangerous. Too much warfarin increases the risk of bleeding. Too little warfarin continues to allow the risk for blood clots. While taking warfarin, you will need to have regular blood tests to measure your blood clotting time. These blood tests usually include both the prothrombin time (PT) and international normalized ratio (INR) tests. The PT and INR results allow your health care provider to adjust your dose of warfarin. The dose can change for many reasons. It is critically important that you take warfarin exactly as prescribed, and that you have your PT and INR levels drawn exactly as directed.  Many foods, especially foods high in vitamin K, can interfere with warfarin and affect the PT and INR results. Foods high in vitamin K include spinach, kale, broccoli, cabbage, collard and turnip greens, brussel sprouts, peas, cauliflower, seaweed, and parsley as well as beef and pork liver, green tea, and soybean oil. You should eat a consistent amount of foods high in vitamin K. Avoid major changes in your diet, or notify your health care provider before  changing your diet. Arrange a visit with a dietitian to answer your questions.  Many medicines can interfere with warfarin and affect the PT and INR results. You must tell your health care provider about any and all medicines you take. This includes all vitamins and supplements. Be especially cautious with aspirin and anti-inflammatory medicines. Ask your health care provider before taking these. Do not take or discontinue any prescribed or over-the-counter medicine except on the advice of your health care provider or pharmacist.  Warfarin can have side effects, primarily excessive bruising or bleeding. You will need to hold pressure over cuts for longer than usual. Your health care provider or pharmacist will discuss other potential side effects.  Alcohol can change the body's ability to handle warfarin. It is best to avoid alcoholic drinks or consume only very small amounts while taking warfarin. Notify your health care provider if you change your alcohol intake.  Notify your dentist or other health care providers before procedures.  Activity. Ask your health care provider how soon you can go back to normal activities. It is important to stay active to prevent blood clots. If you are on anticoagulant medicine, avoid contact sports.  Exercise. It is very important to exercise. This is especially important while traveling, sitting, or standing for long periods of time. Exercise your legs by walking or by pumping the muscles frequently. Take frequent walks.  Compression stockings. These are tight elastic stockings that apply pressure to the lower legs. This pressure can help keep the blood in the legs from clotting. You may need to wear compression stockings at home to help prevent a DVT.  Do not smoke. If you smoke, quit. Ask your health care provider for help with quitting smoking.  Learn as much as you can about DVT. Knowing more about the condition should help you keep it from coming  back.  Wear a medical alert bracelet or carry a medical alert card. SEEK MEDICAL CARE IF:  You notice a rapid heartbeat.  You feel weaker or more tired than  usual.  You feel faint.  You notice increased bruising.  You feel your symptoms are not getting better in the time expected.  You believe you are having side effects of medicine. SEEK IMMEDIATE MEDICAL CARE IF:  You have chest pain.  You have trouble breathing.  You have new or increased swelling or pain in one leg.  You cough up blood.  You notice blood in vomit, in a bowel movement, or in urine. MAKE SURE YOU:  Understand these instructions.  Will watch your condition.  Will get help right away if you are not doing well or get worse. Document Released: 09/21/2005 Document Revised: 07/12/2013 Document Reviewed: 05/29/2013 [facility] Patient Information 2014 [place].

## 2013-12-12 NOTE — Patient Instructions (Signed)
Your left lower leg swelling as a result of a "clot". This will be treated with daily Lovenox injections. You received your first one today in the clinic and were instructed on how to give these to yourself. Followup as previously scheduled with Dr. Alen Blew on 12/19/2048

## 2013-12-12 NOTE — Progress Notes (Signed)
*  PRELIMINARY RESULTS* Vascular Ultrasound Lower extremity venous duplex has been completed.  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 Right = negative for DVT.  Called results to [facility]. Patient instructed to return to cancer center.   Landry Mellow, RDMS, RVT  12/12/2013, 1:28 PM

## 2013-12-13 ENCOUNTER — Encounter: Payer: Self-pay | Admitting: Oncology

## 2013-12-13 LAB — CANCER ANTIGEN 19-9: CA 19-9: 83931 U/mL — ABNORMAL HIGH (ref <35.0)

## 2013-12-13 NOTE — Progress Notes (Signed)
Faxed disability questionaire with office notes to [employer] [facility] @ [phone]

## 2013-12-14 ENCOUNTER — Encounter: Payer: Self-pay | Admitting: Oncology

## 2013-12-14 NOTE — Progress Notes (Signed)
Put husband's fmla form in registration desk. °

## 2013-12-19 ENCOUNTER — Ambulatory Visit (HOSPITAL_BASED_OUTPATIENT_CLINIC_OR_DEPARTMENT_OTHER): Payer: PRIVATE HEALTH INSURANCE

## 2013-12-19 ENCOUNTER — Other Ambulatory Visit (HOSPITAL_BASED_OUTPATIENT_CLINIC_OR_DEPARTMENT_OTHER): Payer: PRIVATE HEALTH INSURANCE

## 2013-12-19 ENCOUNTER — Encounter: Payer: Self-pay | Admitting: Oncology

## 2013-12-19 ENCOUNTER — Ambulatory Visit (HOSPITAL_BASED_OUTPATIENT_CLINIC_OR_DEPARTMENT_OTHER): Payer: PRIVATE HEALTH INSURANCE | Admitting: Oncology

## 2013-12-19 ENCOUNTER — Encounter: Payer: Self-pay | Admitting: *Deleted

## 2013-12-19 VITALS — HR 98 | Temp 98.0°F | Resp 18 | Ht 65.0 in | Wt 153.7 lb

## 2013-12-19 DIAGNOSIS — C252 Malignant neoplasm of tail of pancreas: Secondary | ICD-10-CM

## 2013-12-19 DIAGNOSIS — R5383 Other fatigue: Secondary | ICD-10-CM

## 2013-12-19 DIAGNOSIS — C787 Secondary malignant neoplasm of liver and intrahepatic bile duct: Secondary | ICD-10-CM

## 2013-12-19 DIAGNOSIS — I82409 Acute embolism and thrombosis of unspecified deep veins of unspecified lower extremity: Secondary | ICD-10-CM

## 2013-12-19 DIAGNOSIS — C786 Secondary malignant neoplasm of retroperitoneum and peritoneum: Secondary | ICD-10-CM

## 2013-12-19 DIAGNOSIS — C259 Malignant neoplasm of pancreas, unspecified: Secondary | ICD-10-CM

## 2013-12-19 DIAGNOSIS — Z5111 Encounter for antineoplastic chemotherapy: Secondary | ICD-10-CM

## 2013-12-19 DIAGNOSIS — R5381 Other malaise: Secondary | ICD-10-CM

## 2013-12-19 LAB — CBC WITH DIFFERENTIAL/PLATELET
BASO%: 0.5 % (ref 0.0–2.0)
Basophils Absolute: 0 10*3/uL (ref 0.0–0.1)
EOS ABS: 0 10*3/uL (ref 0.0–0.5)
EOS%: 0.7 % (ref 0.0–7.0)
HCT: 32.4 % — ABNORMAL LOW (ref 34.8–46.6)
HGB: 11.1 g/dL — ABNORMAL LOW (ref 11.6–15.9)
LYMPH%: 26.8 % (ref 14.0–49.7)
MCH: 30.1 pg (ref 25.1–34.0)
MCHC: 34.3 g/dL (ref 31.5–36.0)
MCV: 87.8 fL (ref 79.5–101.0)
MONO#: 0.6 10*3/uL (ref 0.1–0.9)
MONO%: 10.7 % (ref 0.0–14.0)
NEUT%: 61.3 % (ref 38.4–76.8)
NEUTROS ABS: 3.6 10*3/uL (ref 1.5–6.5)
Platelets: 203 10*3/uL (ref 145–400)
RBC: 3.69 10*6/uL — AB (ref 3.70–5.45)
RDW: 14.3 % (ref 11.2–14.5)
WBC: 5.9 10*3/uL (ref 3.9–10.3)
lymph#: 1.6 10*3/uL (ref 0.9–3.3)
nRBC: 1 % — ABNORMAL HIGH (ref 0–0)

## 2013-12-19 LAB — COMPREHENSIVE METABOLIC PANEL (CC13)
ALT: 160 U/L — ABNORMAL HIGH (ref 0–55)
ANION GAP: 12 meq/L — AB (ref 3–11)
AST: 76 U/L — ABNORMAL HIGH (ref 5–34)
Albumin: 2.7 g/dL — ABNORMAL LOW (ref 3.5–5.0)
Alkaline Phosphatase: 157 U/L — ABNORMAL HIGH (ref 40–150)
BUN: 7.1 mg/dL (ref 7.0–26.0)
CO2: 24 meq/L (ref 22–29)
Calcium: 9.1 mg/dL (ref 8.4–10.4)
Chloride: 97 mEq/L — ABNORMAL LOW (ref 98–109)
Creatinine: 0.8 mg/dL (ref 0.6–1.1)
Glucose: 561 mg/dl (ref 70–140)
Potassium: 3.7 mEq/L (ref 3.5–5.1)
Sodium: 133 mEq/L — ABNORMAL LOW (ref 136–145)
Total Bilirubin: 0.45 mg/dL (ref 0.20–1.20)
Total Protein: 6.1 g/dL — ABNORMAL LOW (ref 6.4–8.3)

## 2013-12-19 LAB — TECHNOLOGIST REVIEW

## 2013-12-19 MED ORDER — DEXAMETHASONE SODIUM PHOSPHATE 10 MG/ML IJ SOLN
10.0000 mg | Freq: Once | INTRAMUSCULAR | Status: AC
  Administered 2013-12-19: 10 mg via INTRAVENOUS

## 2013-12-19 MED ORDER — PACLITAXEL PROTEIN-BOUND CHEMO INJECTION 100 MG
125.0000 mg/m2 | Freq: Once | INTRAVENOUS | Status: AC
  Administered 2013-12-19: 225 mg via INTRAVENOUS
  Filled 2013-12-19: qty 45

## 2013-12-19 MED ORDER — DEXAMETHASONE SODIUM PHOSPHATE 10 MG/ML IJ SOLN
INTRAMUSCULAR | Status: AC
  Filled 2013-12-19: qty 1

## 2013-12-19 MED ORDER — SODIUM CHLORIDE 0.9 % IV SOLN
Freq: Once | INTRAVENOUS | Status: AC
  Administered 2013-12-19: 14:00:00 via INTRAVENOUS

## 2013-12-19 MED ORDER — ENOXAPARIN SODIUM 100 MG/ML ~~LOC~~ SOLN
110.0000 mg | SUBCUTANEOUS | Status: DC
Start: 2013-12-19 — End: 2013-12-22

## 2013-12-19 MED ORDER — SODIUM CHLORIDE 0.9 % IV SOLN
1000.0000 mg/m2 | Freq: Once | INTRAVENOUS | Status: AC
  Administered 2013-12-19: 1862 mg via INTRAVENOUS
  Filled 2013-12-19: qty 48.97

## 2013-12-19 MED ORDER — ONDANSETRON 8 MG/NS 50 ML IVPB
INTRAVENOUS | Status: AC
  Filled 2013-12-19: qty 8

## 2013-12-19 MED ORDER — HEPARIN SOD (PORK) LOCK FLUSH 100 UNIT/ML IV SOLN
500.0000 [IU] | Freq: Once | INTRAVENOUS | Status: AC | PRN
  Administered 2013-12-19: 500 [IU]
  Filled 2013-12-19: qty 5

## 2013-12-19 MED ORDER — ONDANSETRON 8 MG/50ML IVPB (CHCC)
8.0000 mg | Freq: Once | INTRAVENOUS | Status: AC
  Administered 2013-12-19: 8 mg via INTRAVENOUS

## 2013-12-19 MED ORDER — SODIUM CHLORIDE 0.9 % IJ SOLN
10.0000 mL | INTRAMUSCULAR | Status: DC | PRN
  Administered 2013-12-19: 10 mL
  Filled 2013-12-19: qty 10

## 2013-12-19 NOTE — Patient Instructions (Signed)
[facility] Discharge Instructions for Patients Receiving Chemotherapy  Today you received the following chemotherapy agents Abraxane/Gemzar  To help prevent nausea and vomiting after your treatment, we encourage you to take your nausea medication as prescribed.   If you develop nausea and vomiting that is not controlled by your nausea medication, call the clinic.   BELOW ARE SYMPTOMS THAT SHOULD BE REPORTED IMMEDIATELY:  *FEVER GREATER THAN 100.5 F  *CHILLS WITH OR WITHOUT FEVER  NAUSEA AND VOMITING THAT IS NOT CONTROLLED WITH YOUR NAUSEA MEDICATION  *UNUSUAL SHORTNESS OF BREATH  *UNUSUAL BRUISING OR BLEEDING  TENDERNESS IN MOUTH AND THROAT WITH OR WITHOUT PRESENCE OF ULCERS  *URINARY PROBLEMS  *BOWEL PROBLEMS  UNUSUAL RASH Items with * indicate a potential emergency and should be followed up as soon as possible.  Feel free to call the clinic you have any questions or concerns. The clinic phone number is [phone].    

## 2013-12-19 NOTE — Progress Notes (Signed)
[place] Work  Clinical Social Work met with pt and pt's husband in the infusion room to offer support and discuss Regulatory affairs officer.  CSW, pt, and pt's husband reviewed and completed ADR.  The document was signed, witnessed, and notarized.  A copy of the document will be placed in the pt's EMR, and copies were provided to pt.  CSW encouraged pt to call with any additional questions or concerns.    Johnnye Lana, MSW, [place] Worker [facility] [phone]

## 2013-12-19 NOTE — Progress Notes (Signed)
Hematology and Oncology Follow Up Visit  Christine Gould [id number] November 27, 1955 58 y.o. 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 This was diagnosed in February of 2015. Her CA 19-9 was 37,431.  Prior Therapy: She is status post endoscopic ultrasound and a biopsy completed on 11/07/2013 confirmed the presence of adenocarcinoma.  Current therapy: She is receiving palliative chemotherapy in the form of Gemzar and Abraxane given on day 1, day 8 of 21 day cycle. Status post 1 cycle. Today is day 8 of cycle 2 of therapy.  Secondary diagnosis: She developed left deep vein thrombosis on 12/12/2013 and have been on Lovenox since.  Interim History:  Christine Gould presents today for a followup visit. She is accompanied by her husband today prior to the administration of day 8 of cycle 2 of chemotherapy. She tolerated the last treatment without any complications. She is reporting a decrease pain and swelling in her left leg. She has tolerated on Lovenox without any complications. She has no problem giving herself the injections and have not reported any pain or injection-related problems. She denies any trauma.  She did not report any nausea or vomiting. She does have some occasional  abdominal pain that is well-controlled with her oxycodone tablets.  Denies hematochezia or melena.She denied any peripheral neuropathy or decline in her performance status. She continues to report grade 1-2 fatigue. She is reporting a poor appetite and about a 4 pound weight loss. She is reporting a decrease in her abdominal distention and less abdominal pain since the start of chemotherapy.   Medications: I have reviewed the patient's current medications.  Current Outpatient Prescriptions  Medication Sig  Dispense Refill  . enoxaparin (LOVENOX) 100 MG/ML injection Inject 1.1 mLs (110 mg total) into the skin daily.  90 Syringe  1  . acetaminophen (TYLENOL) 500 MG tablet Take 1,000 mg by mouth every 6 (six) hours as needed for mild pain or moderate pain.      Marland Kitchen ALPRAZolam (XANAX) 0.5 MG tablet       . amLODipine (NORVASC) 10 MG tablet Take 10 mg by mouth every morning.      . citalopram (CELEXA) 20 MG tablet Take 20 mg by mouth every morning.      . glyBURIDE (DIABETA) 5 MG tablet Take 5 mg by mouth daily with breakfast.      . hydrochlorothiazide (HYDRODIURIL) 25 MG tablet Take 25 mg by mouth every morning.       Marland Kitchen HYDROcodone-acetaminophen (NORCO/VICODIN) 5-325 MG per tablet Take 1 tablet by mouth every 6 (six) hours as needed for moderate pain.       Marland Kitchen ibuprofen (ADVIL,MOTRIN) 200 MG tablet Take 400 mg by mouth every 6 (six) hours as needed for mild pain or moderate pain.      Marland Kitchen lidocaine-prilocaine (EMLA) cream Apply 1 application topically daily as needed (port).      . metFORMIN (GLUCOPHAGE) 1000 MG tablet Take 1,000 mg by mouth 2 (two) times daily with a meal.      . Multiple Vitamin (MULTIVITAMIN WITH MINERALS) TABS tablet Take 1 tablet by mouth daily.      Marland Kitchen olmesartan-hydrochlorothiazide (BENICAR HCT) 40-25 MG per tablet Take 1 tablet by mouth every morning.      Marland Kitchen oxyCODONE (OXY IR/ROXICODONE) 5 MG immediate release tablet Take 1 tablet (  5 mg total) by mouth every 4 (four) hours as needed for severe pain.  30 tablet  0  . prochlorperazine (COMPAZINE) 10 MG tablet Take 10 mg by mouth every 6 (six) hours as needed for nausea or vomiting.      . traZODone (DESYREL) 100 MG tablet Take 100 mg by mouth at bedtime.       No current facility-administered medications for this visit.     Allergies:  Allergies  Allergen Reactions  . Statins Other (See Comments)    Cramps-fatigue  . Toprol Xl [Metoprolol] Other (See Comments)    Pain     Past Medical History, Surgical history, Social  history, and Family History were reviewed and updated.  Review of Systems: Constitutional:  Negative for fever, chills, night sweats, anorexia, weight loss, pain. Cardiovascular: no chest pain or dyspnea on exertion Respiratory: no cough, shortness of breath, or wheezing Neurological: no TIA or stroke symptoms Dermatological: negative for pruritus, rash and skin lesion changes ENT: negative for - headaches, nasal discharge or sinus pain Skin: Negative. Gastrointestinal: Per history of present illness Genito-Urinary: no dysuria, trouble voiding, or hematuria Hematological and Lymphatic: negative for - bleeding problems, blood clots or bruising Breast: negative Musculoskeletal: positive for - left lower extremity pain and swelling  Remaining ROS negative.  Physical Exam: Pulse 98, temperature 98 F (36.7 C), temperature source Oral, resp. rate 18, height 5\' 5"  (1.651 m), weight 153 lb 11.2 oz (69.718 kg), SpO2 100.00%.  ECOG: 1  General appearance: alert, cooperative and appears stated age Head: Normocephalic, without obvious abnormality, atraumatic Neck: no adenopathy, no carotid bruit, no JVD, supple, symmetrical, trachea midline and thyroid not enlarged, symmetric, no tenderness/mass/nodules Lymph nodes: Cervical, supraclavicular, and axillary nodes normal. Heart:regular rate and rhythm, S1, S2 normal, no murmur, click, rub or gallop Lung:chest clear, no wheezing, rales, normal symmetric air entry, Heart exam - S1, S2 normal, no murmur, no gallop, rate regular Abdomin: soft, non-tender, without masses or organomegaly.No appreciable  ascites or shifting dullness. EXT: Trace edema on the left more than the right but much improved since last week.  Lab Results: Lab Results  Component Value Date   WBC 5.9 12/19/2013   HGB 11.1* 12/19/2013   HCT 32.4* 12/19/2013   MCV 87.8 12/19/2013   PLT 203 12/19/2013     Chemistry      Component Value Date/Time   NA 133* 12/12/2013 1151   K  4.9 12/12/2013 1151   CO2 27 12/12/2013 1151   BUN 5.8* 12/12/2013 1151   CREATININE 0.7 12/12/2013 1151      Component Value Date/Time   CALCIUM 9.2 12/12/2013 1151   ALKPHOS 161* 12/12/2013 1151   AST 22 12/12/2013 1151   ALT 79* 12/12/2013 1151   BILITOT 0.47 12/12/2013 1151      Impression and Plan:   58 year old woman with the following issues:  1. 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 She is receiving palliative chemotherapy in the form of Gemzar and Abraxane. She tolerated cycle 1 without any complications. She is ready to proceed with day 8 of cycle #2. The plan is to restage her with a CT scan after the completion of cycle 3. I will schedule the CT scan with her April 7 visit.  2. Abdominal pain: She is currently utilizing oxycodone with good relief. Pain has improved after the start of chemotherapy.  3. IV access: She has a Port-A-Cath in  place and inserted without any complications.  4. Nausea prophylaxis: She has antiemetics prescribed and appears to be what you well.  5.Thrombosis: Acute onset left lower extremity pain and swelling. Ultrasound Doppler on 12/12/2013 confirmed the presence of deep vein thrombosis. She is currently on Lovenox without any complications and continuous improvement in her left extremity swelling. I gave prescription for generic enoxaparin which is covered by her insurance company. My plan is to anticoagulate her and definitely given her high risk of thrombosis with malignancy.  6. Thrombocytopenia: resolved. Will continue with treatment if her platelet count is above 90,000.  7. Followup: On 01/02/2014 for cycle 3 day 1.   Zola Button, MD 3/17/20151:36 PM

## 2013-12-20 ENCOUNTER — Encounter: Payer: Self-pay | Admitting: Oncology

## 2013-12-20 LAB — CANCER ANTIGEN 19-9: CA 19-9: 54787.4 U/mL — ABNORMAL HIGH (ref <35.0)

## 2013-12-20 NOTE — Progress Notes (Signed)
Called and left a message for pat to call back about Abraxane asst.

## 2013-12-21 ENCOUNTER — Encounter: Payer: Self-pay | Admitting: Oncology

## 2013-12-21 NOTE — Progress Notes (Signed)
Faxed 12/19/13 office note to [facility] @ [phone]

## 2013-12-22 ENCOUNTER — Telehealth: Payer: Self-pay | Admitting: Medical Oncology

## 2013-12-22 ENCOUNTER — Other Ambulatory Visit: Payer: Self-pay | Admitting: Medical Oncology

## 2013-12-22 DIAGNOSIS — C259 Malignant neoplasm of pancreas, unspecified: Secondary | ICD-10-CM

## 2013-12-22 MED ORDER — ENOXAPARIN SODIUM 100 MG/ML ~~LOC~~ SOLN: 110.0000 mg | SUBCUTANEOUS | Status: DC

## 2013-12-22 NOTE — Telephone Encounter (Signed)
Call from patient's pharmacy informing office that patient's copay for Lovenox would be approx $2000.   Called patient to inform her working on having [facility] outpatient pharmacy to fill and see what cost is, and informed patient per [facility] outpatient her portion would be $490.10. Patient states she cannot afford this amount. Spoke with Dannielle Huh in managed care, states she will contact [employer] to see if possibility to get price reduced. States she doesn't have any at home and has not had an injection since Wednesday 03/18. Dr. Alen Blew informed.  Per Benjamine Mola, [employer] unable to assist.  Patient informed office was able to acquire 10 lovenox injections from our pharmacy and patient to pick up Saturday in the infusion tx room . Instructions given for where to p.u. Saturday before 1pm. Recommended to patient to call her insurance directly and inquire as to reason why not being covered. Patient expressed understanding and thanks. Reiterated to patient that it is very important that she does not stop injections or skip daily dose. Encouraged her to contact office with any questions or concerns.

## 2013-12-26 ENCOUNTER — Ambulatory Visit: Payer: PRIVATE HEALTH INSURANCE

## 2014-01-02 ENCOUNTER — Encounter: Payer: Self-pay | Admitting: Physician Assistant

## 2014-01-02 ENCOUNTER — Ambulatory Visit (HOSPITAL_BASED_OUTPATIENT_CLINIC_OR_DEPARTMENT_OTHER): Payer: PRIVATE HEALTH INSURANCE

## 2014-01-02 ENCOUNTER — Ambulatory Visit (HOSPITAL_BASED_OUTPATIENT_CLINIC_OR_DEPARTMENT_OTHER): Payer: PRIVATE HEALTH INSURANCE | Admitting: *Deleted

## 2014-01-02 ENCOUNTER — Ambulatory Visit (HOSPITAL_BASED_OUTPATIENT_CLINIC_OR_DEPARTMENT_OTHER): Payer: PRIVATE HEALTH INSURANCE | Admitting: Physician Assistant

## 2014-01-02 ENCOUNTER — Telehealth: Payer: Self-pay | Admitting: Oncology

## 2014-01-02 VITALS — BP 144/74 | HR 102 | Temp 98.2°F | Resp 18 | Ht 65.0 in | Wt 153.2 lb

## 2014-01-02 DIAGNOSIS — C252 Malignant neoplasm of tail of pancreas: Secondary | ICD-10-CM

## 2014-01-02 DIAGNOSIS — I82409 Acute embolism and thrombosis of unspecified deep veins of unspecified lower extremity: Secondary | ICD-10-CM

## 2014-01-02 DIAGNOSIS — C787 Secondary malignant neoplasm of liver and intrahepatic bile duct: Secondary | ICD-10-CM

## 2014-01-02 DIAGNOSIS — C259 Malignant neoplasm of pancreas, unspecified: Secondary | ICD-10-CM

## 2014-01-02 DIAGNOSIS — Z5111 Encounter for antineoplastic chemotherapy: Secondary | ICD-10-CM

## 2014-01-02 DIAGNOSIS — C786 Secondary malignant neoplasm of retroperitoneum and peritoneum: Secondary | ICD-10-CM

## 2014-01-02 DIAGNOSIS — R109 Unspecified abdominal pain: Secondary | ICD-10-CM

## 2014-01-02 LAB — CBC WITH DIFFERENTIAL/PLATELET
BASO%: 0.7 % (ref 0.0–2.0)
Basophils Absolute: 0 10*3/uL (ref 0.0–0.1)
EOS ABS: 0.1 10*3/uL (ref 0.0–0.5)
EOS%: 2 % (ref 0.0–7.0)
HCT: 32.5 % — ABNORMAL LOW (ref 34.8–46.6)
HGB: 10.7 g/dL — ABNORMAL LOW (ref 11.6–15.9)
LYMPH%: 23.8 % (ref 14.0–49.7)
MCH: 30.9 pg (ref 25.1–34.0)
MCHC: 32.9 g/dL (ref 31.5–36.0)
MCV: 94.2 fL (ref 79.5–101.0)
MONO#: 0.9 10*3/uL (ref 0.1–0.9)
MONO%: 13.9 % (ref 0.0–14.0)
NEUT%: 59.6 % (ref 38.4–76.8)
NEUTROS ABS: 3.6 10*3/uL (ref 1.5–6.5)
Platelets: 309 10*3/uL (ref 145–400)
RBC: 3.45 10*6/uL — ABNORMAL LOW (ref 3.70–5.45)
RDW: 19.3 % — AB (ref 11.2–14.5)
WBC: 6.1 10*3/uL (ref 3.9–10.3)
lymph#: 1.5 10*3/uL (ref 0.9–3.3)

## 2014-01-02 LAB — COMPREHENSIVE METABOLIC PANEL (CC13)
ALBUMIN: 2.7 g/dL — AB (ref 3.5–5.0)
ALT: 31 U/L (ref 0–55)
AST: 17 U/L (ref 5–34)
Alkaline Phosphatase: 124 U/L (ref 40–150)
Anion Gap: 10 mEq/L (ref 3–11)
BUN: 6.2 mg/dL — ABNORMAL LOW (ref 7.0–26.0)
CO2: 26 mEq/L (ref 22–29)
Calcium: 9.1 mg/dL (ref 8.4–10.4)
Chloride: 99 mEq/L (ref 98–109)
Creatinine: 0.7 mg/dL (ref 0.6–1.1)
GLUCOSE: 388 mg/dL — AB (ref 70–140)
POTASSIUM: 4.1 meq/L (ref 3.5–5.1)
Sodium: 135 mEq/L — ABNORMAL LOW (ref 136–145)
TOTAL PROTEIN: 5.7 g/dL — AB (ref 6.4–8.3)
Total Bilirubin: 0.36 mg/dL (ref 0.20–1.20)

## 2014-01-02 MED ORDER — ONDANSETRON 8 MG/50ML IVPB (CHCC)
8.0000 mg | Freq: Once | INTRAVENOUS | Status: AC
  Administered 2014-01-02: 8 mg via INTRAVENOUS

## 2014-01-02 MED ORDER — GEMCITABINE HCL CHEMO INJECTION 1 GM/26.3ML
1000.0000 mg/m2 | Freq: Once | INTRAVENOUS | Status: AC
  Administered 2014-01-02: 1862 mg via INTRAVENOUS
  Filled 2014-01-02: qty 48.97

## 2014-01-02 MED ORDER — ONDANSETRON 8 MG/NS 50 ML IVPB
INTRAVENOUS | Status: AC
  Filled 2014-01-02: qty 8

## 2014-01-02 MED ORDER — SODIUM CHLORIDE 0.9 % IJ SOLN
10.0000 mL | INTRAMUSCULAR | Status: DC | PRN
  Administered 2014-01-02: 10 mL
  Filled 2014-01-02: qty 10

## 2014-01-02 MED ORDER — PACLITAXEL PROTEIN-BOUND CHEMO INJECTION 100 MG
125.0000 mg/m2 | Freq: Once | INTRAVENOUS | Status: AC
  Administered 2014-01-02: 225 mg via INTRAVENOUS
  Filled 2014-01-02: qty 45

## 2014-01-02 MED ORDER — SODIUM CHLORIDE 0.9 % IV SOLN
Freq: Once | INTRAVENOUS | Status: AC
  Administered 2014-01-02: 13:00:00 via INTRAVENOUS

## 2014-01-02 MED ORDER — HEPARIN SOD (PORK) LOCK FLUSH 100 UNIT/ML IV SOLN
500.0000 [IU] | Freq: Once | INTRAVENOUS | Status: AC | PRN
  Administered 2014-01-02: 500 [IU]
  Filled 2014-01-02: qty 5

## 2014-01-02 MED ORDER — DEXAMETHASONE SODIUM PHOSPHATE 10 MG/ML IJ SOLN
INTRAMUSCULAR | Status: AC
  Filled 2014-01-02: qty 1

## 2014-01-02 MED ORDER — DEXAMETHASONE SODIUM PHOSPHATE 10 MG/ML IJ SOLN
10.0000 mg | Freq: Once | INTRAMUSCULAR | Status: AC
  Administered 2014-01-02: 10 mg via INTRAVENOUS

## 2014-01-02 NOTE — Patient Instructions (Signed)
Continue Lovenox injections daily Follow up with Dr. Alen Blew as scheduled in 1 week

## 2014-01-02 NOTE — Telephone Encounter (Signed)
[facility] PT APPT SCHEDULE FOR APRIL °

## 2014-01-02 NOTE — Progress Notes (Signed)
Hematology and Oncology Follow Up Visit  Christine Gould [id number] 1955-12-20 58 y.o. 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 This was diagnosed in February of 2015. Her CA 19-9 was 37,431.  DVT - left lower extremity 12/12/2013  Prior Therapy: She is status post endoscopic ultrasound and a biopsy completed on 11/07/2013 confirmed the presence of adenocarcinoma.  Current therapy:  1. She is receiving palliative chemotherapy in the form of Gemzar and Abraxane given on day 1, day 8 of 21 day cycle. Status post 2 cycles. Today is day 1 of cycle 3 of therapy. 2. Lovenox 110 mg subcutaneously daily  Interim History:  Christine Gould presents today for a followup visit. She is accompanied by her daughter today prior to the administration of day 1 of cycle 3 of chemotherapy. She tolerated the last treatment without any complications. She is tolerating her Lovenox injections without difficulty. She  Notes decreased pain and swelling of the left lower extremity. She denies any nausea or vomiting.  Denies hematochezia or melena.She denied any peripheral neuropathy or decline in her performance status. She continues to report grade 1 fatigue.  Medications: I have reviewed the patient's current medications.  Current Outpatient Prescriptions  Medication Sig Dispense Refill  . acetaminophen (TYLENOL) 500 MG tablet Take 1,000 mg by mouth every 6 (six) hours as needed for mild pain or moderate pain.      Marland Kitchen ALPRAZolam (XANAX) 0.5 MG tablet       . amLODipine (NORVASC) 10 MG tablet Take 10 mg by mouth every morning.      . citalopram (CELEXA) 20 MG tablet Take 20 mg by mouth every morning.      . enoxaparin (LOVENOX) 100 MG/ML injection Inject 1.1 mLs (110 mg total) into the skin daily.   90 Syringe  1  . glyBURIDE (DIABETA) 5 MG tablet Take 5 mg by mouth daily with breakfast.      . ibuprofen (ADVIL,MOTRIN) 200 MG tablet Take 400 mg by mouth every 6 (six) hours as needed for mild pain or moderate pain.      Marland Kitchen lidocaine-prilocaine (EMLA) cream Apply 1 application topically daily as needed (port).      . metFORMIN (GLUCOPHAGE) 1000 MG tablet Take 1,000 mg by mouth 2 (two) times daily with a meal.      . Multiple Vitamin (MULTIVITAMIN WITH MINERALS) TABS tablet Take 1 tablet by mouth daily.      Marland Kitchen olmesartan-hydrochlorothiazide (BENICAR HCT) 40-25 MG per tablet Take 1 tablet by mouth every morning.      Marland Kitchen oxyCODONE (OXY IR/ROXICODONE) 5 MG immediate release tablet Take 1 tablet (5 mg total) by mouth every 4 (four) hours as needed for severe pain.  30 tablet  0  . prochlorperazine (COMPAZINE) 10 MG tablet Take 10 mg by mouth every 6 (six) hours as needed for nausea or vomiting.      . traZODone (DESYREL) 100 MG tablet Take 100 mg by mouth at bedtime.      . hydrochlorothiazide (HYDRODIURIL) 25 MG tablet Take 25 mg by mouth every morning.       Marland Kitchen HYDROcodone-acetaminophen (NORCO/VICODIN) 5-325 MG per tablet Take 1 tablet by mouth every 6 (six) hours as needed for moderate pain.        No current facility-administered medications for this visit.  Facility-Administered Medications Ordered in Other Visits  Medication Dose Route Frequency Provider Last Rate Last Dose  . Gemcitabine HCl (GEMZAR) 1,862 mg in sodium chloride 0.9 % 100 mL chemo infusion  1,000 mg/m2 (Treatment Plan Actual) Intravenous Once Wyatt Portela, MD      . heparin lock flush 100 unit/mL  500 Units Intracatheter Once PRN Wyatt Portela, MD      . ondansetron (ZOFRAN) IVPB 8 mg  8 mg Intravenous Once Wyatt Portela, MD   8 mg at 01/02/14 1259  . PACLitaxel-protein bound (ABRAXANE) chemo infusion 225 mg  125 mg/m2 (Treatment Plan Actual) Intravenous Once Wyatt Portela, MD      . sodium chloride 0.9 % injection 10 mL   10 mL Intracatheter PRN Wyatt Portela, MD         Allergies:  Allergies  Allergen Reactions  . Statins Other (See Comments)    Cramps-fatigue  . Toprol Xl [Metoprolol] Other (See Comments)    Pain     Past Medical History, Surgical history, Social history, and Family History were reviewed and updated.  Review of Systems: Constitutional:  Negative for fever, chills, night sweats, anorexia, weight loss, pain. Cardiovascular: no chest pain or dyspnea on exertion Respiratory: no cough, shortness of breath, or wheezing Neurological: no TIA or stroke symptoms Dermatological: negative for pruritus, rash and skin lesion changes ENT: negative for - headaches, nasal discharge or sinus pain Skin: Negative. Gastrointestinal: Per history of present illness Genito-Urinary: no dysuria, trouble voiding, or hematuria Hematological and Lymphatic: positive for - blood clots and left lower extremity Breast: negative Musculoskeletal: positive for - left lower extremity pain and swelling  Remaining ROS negative.  Physical Exam: Blood pressure 144/74, pulse 102, temperature 98.2 F (36.8 C), temperature source Oral, resp. rate 18, height 5\' 5"  (1.651 m), weight 153 lb 3.2 oz (69.491 kg), SpO2 98.00%.  ECOG: 1  General appearance: alert, cooperative and appears stated age Head: Normocephalic, without obvious abnormality, atraumatic Neck: no adenopathy, no carotid bruit, no JVD, supple, symmetrical, trachea midline and thyroid not enlarged, symmetric, no tenderness/mass/nodules Lymph nodes: Cervical, supraclavicular, and axillary nodes normal. Heart:regular rate and rhythm, S1, S2 normal, no murmur, click, rub or gallop Lung:chest clear, no wheezing, rales, normal symmetric air entry, Heart exam - S1, S2 normal, no murmur, no gallop, rate regular Abdomin: soft, non-tender, without masses or organomegaly.No appreciable  ascites or shifting dullness. EXT: 1+  soft tissue edema left lower extremity  with decreased warmth and tenderness, no erythema. Right lower extremity with trace edema  Lab Results: Lab Results  Component Value Date   WBC 6.1 01/02/2014   HGB 10.7* 01/02/2014   HCT 32.5* 01/02/2014   MCV 94.2 01/02/2014   PLT 309 01/02/2014     Chemistry      Component Value Date/Time   NA 135* 01/02/2014 1112   K 4.1 01/02/2014 1112   CO2 26 01/02/2014 1112   BUN 6.2* 01/02/2014 1112   CREATININE 0.7 01/02/2014 1112      Component Value Date/Time   CALCIUM 9.1 01/02/2014 1112   ALKPHOS 124 01/02/2014 1112   AST 17 01/02/2014 1112   ALT 31 01/02/2014 1112   BILITOT 0.36 01/02/2014 1112      Impression and Plan:   57 year old woman with the following issues:  1. 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 She is receiving palliative chemotherapy in the form of Gemzar  and Abraxane. She tolerated cycle 1 without any complications. She is ready to proceed with day 1 of cycle #2.  2. Abdominal pain: She is currently utilizing oxycodone with good relief. Pain has improved after the start of chemotherapy.  3. IV access: She has a Port-A-Cath in place and inserted without any complications.  4. Nausea prophylaxis: She has antiemetics prescribed and appears to be well controlled.  5.Thrombosis: Acute left lower extremity DVT, diagnosed 12/12/2013 by doppler. Continue Lovenox 110 mg subcutaneously daily (1.5 mg per kilogram based on her weight of 72 kg).   6. Thrombocytopenia: resolved. Will continue with treatment if her platelet count is above 90,000.  7. Followup: On 01/09/2014 for day 8 of cycle 3. She will be scheduled for restaging CT scans after completion of cycle #3.   Carlton Adam, PA-C  3/31/20151:09 PM

## 2014-01-02 NOTE — Patient Instructions (Signed)
[facility] Discharge Instructions for Patients Receiving Chemotherapy  Today you received the following chemotherapy agents abraxane/gemzar   To help prevent nausea and vomiting after your treatment, we encourage you to take your nausea medication as directed.   If you develop nausea and vomiting that is not controlled by your nausea medication, call the clinic.   BELOW ARE SYMPTOMS THAT SHOULD BE REPORTED IMMEDIATELY:  *FEVER GREATER THAN 100.5 F  *CHILLS WITH OR WITHOUT FEVER  NAUSEA AND VOMITING THAT IS NOT CONTROLLED WITH YOUR NAUSEA MEDICATION  *UNUSUAL SHORTNESS OF BREATH  *UNUSUAL BRUISING OR BLEEDING  TENDERNESS IN MOUTH AND THROAT WITH OR WITHOUT PRESENCE OF ULCERS  *URINARY PROBLEMS  *BOWEL PROBLEMS  UNUSUAL RASH Items with * indicate a potential emergency and should be followed up as soon as possible.  Feel free to call the clinic you have any questions or concerns. The clinic phone number is [phone].  

## 2014-01-03 LAB — CANCER ANTIGEN 19-9: CA 19 9: 16126.6 U/mL — AB (ref <35.0)

## 2014-01-08 ENCOUNTER — Telehealth: Payer: Self-pay | Admitting: Dietician

## 2014-01-08 NOTE — Telephone Encounter (Signed)
Brief Outpatient Oncology Nutrition Note  Patient has been identified to be at risk on malnutrition screen.  Wt Readings from Last 10 Encounters:  01/02/14 153 lb 3.2 oz (69.491 kg)  12/19/13 153 lb 11.2 oz (69.718 kg)  12/12/13 158 lb 4.8 oz (71.804 kg)  11/28/13 159 lb 3 oz (72.207 kg)  11/08/13 166 lb 6.4 oz (75.479 kg)  11/07/13 160 lb (72.576 kg)  11/07/13 160 lb (72.576 kg)    Dx:  Stage IV Pancreatic Cancer Diagnosed in February 2015.  Patient of Dr. Alen Blew.  Called patient secondary to weight loss.  Patient was unavailable.  Will arrange to have patient seen by the [place] [facility].  Antonieta Iba, RD, LDN

## 2014-01-08 NOTE — Telephone Encounter (Signed)
Patient returned my call.  Discussed with her recommendations to see the [place] and that referral has been made for an appointment.  Answered patients questions and patient states that she is eating well currently.  Antonieta Iba, RD, LDN

## 2014-01-09 ENCOUNTER — Encounter: Payer: Self-pay | Admitting: Oncology

## 2014-01-09 ENCOUNTER — Ambulatory Visit (HOSPITAL_BASED_OUTPATIENT_CLINIC_OR_DEPARTMENT_OTHER): Payer: PRIVATE HEALTH INSURANCE

## 2014-01-09 ENCOUNTER — Ambulatory Visit (HOSPITAL_BASED_OUTPATIENT_CLINIC_OR_DEPARTMENT_OTHER): Payer: PRIVATE HEALTH INSURANCE | Admitting: Oncology

## 2014-01-09 ENCOUNTER — Other Ambulatory Visit (HOSPITAL_BASED_OUTPATIENT_CLINIC_OR_DEPARTMENT_OTHER): Payer: PRIVATE HEALTH INSURANCE

## 2014-01-09 ENCOUNTER — Telehealth: Payer: Self-pay | Admitting: Oncology

## 2014-01-09 VITALS — BP 115/67 | HR 105 | Temp 98.0°F | Resp 18 | Ht 65.0 in | Wt 149.2 lb

## 2014-01-09 DIAGNOSIS — C252 Malignant neoplasm of tail of pancreas: Secondary | ICD-10-CM

## 2014-01-09 DIAGNOSIS — I82409 Acute embolism and thrombosis of unspecified deep veins of unspecified lower extremity: Secondary | ICD-10-CM

## 2014-01-09 DIAGNOSIS — C787 Secondary malignant neoplasm of liver and intrahepatic bile duct: Secondary | ICD-10-CM

## 2014-01-09 DIAGNOSIS — C786 Secondary malignant neoplasm of retroperitoneum and peritoneum: Secondary | ICD-10-CM

## 2014-01-09 DIAGNOSIS — Z5111 Encounter for antineoplastic chemotherapy: Secondary | ICD-10-CM

## 2014-01-09 DIAGNOSIS — C259 Malignant neoplasm of pancreas, unspecified: Secondary | ICD-10-CM

## 2014-01-09 DIAGNOSIS — R7309 Other abnormal glucose: Secondary | ICD-10-CM

## 2014-01-09 LAB — COMPREHENSIVE METABOLIC PANEL (CC13)
ALK PHOS: 120 U/L (ref 40–150)
ALT: 44 U/L (ref 0–55)
AST: 30 U/L (ref 5–34)
Albumin: 2.8 g/dL — ABNORMAL LOW (ref 3.5–5.0)
Anion Gap: 12 mEq/L — ABNORMAL HIGH (ref 3–11)
BILIRUBIN TOTAL: 0.4 mg/dL (ref 0.20–1.20)
BUN: 6.7 mg/dL — ABNORMAL LOW (ref 7.0–26.0)
CO2: 27 mEq/L (ref 22–29)
Calcium: 9.4 mg/dL (ref 8.4–10.4)
Chloride: 96 mEq/L — ABNORMAL LOW (ref 98–109)
Creatinine: 0.7 mg/dL (ref 0.6–1.1)
Glucose: 474 mg/dl — ABNORMAL HIGH (ref 70–140)
Potassium: 3.6 mEq/L (ref 3.5–5.1)
Sodium: 136 mEq/L (ref 136–145)
Total Protein: 6.3 g/dL — ABNORMAL LOW (ref 6.4–8.3)

## 2014-01-09 LAB — CBC WITH DIFFERENTIAL/PLATELET
BASO%: 0.4 % (ref 0.0–2.0)
BASOS ABS: 0 10*3/uL (ref 0.0–0.1)
EOS ABS: 0 10*3/uL (ref 0.0–0.5)
EOS%: 0.2 % (ref 0.0–7.0)
HCT: 32.6 % — ABNORMAL LOW (ref 34.8–46.6)
HGB: 10.7 g/dL — ABNORMAL LOW (ref 11.6–15.9)
LYMPH%: 22.5 % (ref 14.0–49.7)
MCH: 30.6 pg (ref 25.1–34.0)
MCHC: 32.8 g/dL (ref 31.5–36.0)
MCV: 93.2 fL (ref 79.5–101.0)
MONO#: 0.7 10*3/uL (ref 0.1–0.9)
MONO%: 10.4 % (ref 0.0–14.0)
NEUT%: 66.5 % (ref 38.4–76.8)
NEUTROS ABS: 4.2 10*3/uL (ref 1.5–6.5)
Platelets: 151 10*3/uL (ref 145–400)
RBC: 3.5 10*6/uL — ABNORMAL LOW (ref 3.70–5.45)
RDW: 18.5 % — ABNORMAL HIGH (ref 11.2–14.5)
WBC: 6.4 10*3/uL (ref 3.9–10.3)
lymph#: 1.4 10*3/uL (ref 0.9–3.3)

## 2014-01-09 MED ORDER — HEPARIN SOD (PORK) LOCK FLUSH 100 UNIT/ML IV SOLN
500.0000 [IU] | Freq: Once | INTRAVENOUS | Status: AC | PRN
  Administered 2014-01-09: 500 [IU]
  Filled 2014-01-09: qty 5

## 2014-01-09 MED ORDER — SODIUM CHLORIDE 0.9 % IJ SOLN
10.0000 mL | INTRAMUSCULAR | Status: DC | PRN
  Administered 2014-01-09: 10 mL
  Filled 2014-01-09: qty 10

## 2014-01-09 MED ORDER — SODIUM CHLORIDE 0.9 % IV SOLN
1000.0000 mg/m2 | Freq: Once | INTRAVENOUS | Status: AC
  Administered 2014-01-09: 1862 mg via INTRAVENOUS
  Filled 2014-01-09: qty 48.97

## 2014-01-09 MED ORDER — PACLITAXEL PROTEIN-BOUND CHEMO INJECTION 100 MG
125.0000 mg/m2 | Freq: Once | INTRAVENOUS | Status: AC
  Administered 2014-01-09: 225 mg via INTRAVENOUS
  Filled 2014-01-09: qty 45

## 2014-01-09 MED ORDER — ONDANSETRON 8 MG/50ML IVPB (CHCC)
8.0000 mg | Freq: Once | INTRAVENOUS | Status: AC
  Administered 2014-01-09: 8 mg via INTRAVENOUS

## 2014-01-09 MED ORDER — DEXAMETHASONE SODIUM PHOSPHATE 10 MG/ML IJ SOLN
10.0000 mg | Freq: Once | INTRAMUSCULAR | Status: AC
  Administered 2014-01-09: 10 mg via INTRAVENOUS

## 2014-01-09 MED ORDER — SODIUM CHLORIDE 0.9 % IV SOLN
Freq: Once | INTRAVENOUS | Status: AC
  Administered 2014-01-09: 14:00:00 via INTRAVENOUS

## 2014-01-09 MED ORDER — DEXAMETHASONE SODIUM PHOSPHATE 10 MG/ML IJ SOLN
INTRAMUSCULAR | Status: AC
  Filled 2014-01-09: qty 1

## 2014-01-09 MED ORDER — ONDANSETRON 8 MG/NS 50 ML IVPB
INTRAVENOUS | Status: AC
  Filled 2014-01-09: qty 8

## 2014-01-09 NOTE — Telephone Encounter (Signed)
gv and printed avs for pt ...gv pt avs

## 2014-01-09 NOTE — Progress Notes (Signed)
Hematology and Oncology Follow Up Visit  Christine Gould [id number] [birth year] 58 y.o. 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 This was diagnosed in February of 2015. Her CA 19-9 was 37,431.  Prior Therapy: She is status post endoscopic ultrasound and a biopsy completed on 11/07/2013 confirmed the presence of adenocarcinoma.  Current therapy: She is receiving palliative chemotherapy in the form of Gemzar and Abraxane given on day 1, day 8 of 21 day cycle. Today is day 8 of cycle 3 of therapy.  Secondary diagnosis: She developed left deep vein thrombosis on 12/12/2013 and have been on Lovenox since.  Interim History:  Mrs. Mariscal presents today for a followup visit. She is accompanied by her husband today prior to the administration of day 8 of cycle 3 of chemotherapy. She tolerated the last treatment without any complications. She is reporting a decrease pain and swelling in her left leg. She has tolerated on Lovenox without any complications. She has no problem giving herself the injections and have not reported any pain or injection-related problems. She did not report any nausea or vomiting. She does have some occasional  abdominal pain that is well-controlled with her oxycodone tablets.  Denies hematochezia or melena.She denied any peripheral neuropathy or decline in her performance status. She continues to report grade 1-2 fatigue. She is reporting a decrease in her abdominal distention and less abdominal pain since the start of chemotherapy. She reports she monitors her blood sugars but not very frequently. She drinks a lot of fluid but a lot of it has lots of sugar in them. She has not reported any alteration of mental status with confusion. She had not reported any  syncope or any other complications.  Medications: I have reviewed the patient's current medications.  Current Outpatient Prescriptions  Medication Sig Dispense Refill  . acetaminophen (TYLENOL) 500 MG tablet Take 1,000 mg by mouth every 6 (six) hours as needed for mild pain or moderate pain.      Marland Kitchen ALPRAZolam (XANAX) 0.5 MG tablet       . amLODipine (NORVASC) 10 MG tablet Take 10 mg by mouth every morning.      . citalopram (CELEXA) 20 MG tablet Take 20 mg by mouth every morning.      . enoxaparin (LOVENOX) 100 MG/ML injection Inject 1.1 mLs (110 mg total) into the skin daily.  90 Syringe  1  . glyBURIDE (DIABETA) 5 MG tablet Take 5 mg by mouth daily with breakfast.      . hydrochlorothiazide (HYDRODIURIL) 25 MG tablet Take 25 mg by mouth every morning.       Marland Kitchen HYDROcodone-acetaminophen (NORCO/VICODIN) 5-325 MG per tablet Take 1 tablet by mouth every 6 (six) hours as needed for moderate pain.       Marland Kitchen ibuprofen (ADVIL,MOTRIN) 200 MG tablet Take 400 mg by mouth every 6 (six) hours as needed for mild pain or moderate pain.      Marland Kitchen lidocaine-prilocaine (EMLA) cream Apply 1 application topically daily as needed (port).      . metFORMIN (GLUCOPHAGE) 1000 MG tablet Take 1,000 mg by mouth 2 (two) times daily with a meal.      . Multiple Vitamin (MULTIVITAMIN WITH MINERALS) TABS tablet Take 1 tablet by mouth daily.      Marland Kitchen olmesartan-hydrochlorothiazide (BENICAR HCT) 40-25 MG  per tablet Take 1 tablet by mouth every morning.      Marland Kitchen oxyCODONE (OXY IR/ROXICODONE) 5 MG immediate release tablet Take 1 tablet (5 mg total) by mouth every 4 (four) hours as needed for severe pain.  30 tablet  0  . prochlorperazine (COMPAZINE) 10 MG tablet Take 10 mg by mouth every 6 (six) hours as needed for nausea or vomiting.      . traZODone (DESYREL) 100 MG tablet Take 100 mg by mouth at bedtime.       No current facility-administered medications for this visit.     Allergies:  Allergies  Allergen Reactions  . Statins  Other (See Comments)    Cramps-fatigue  . Toprol Xl [Metoprolol] Other (See Comments)    Pain     Past Medical History, Surgical history, Social history, and Family History were reviewed and updated.  Review of Systems: Constitutional:  Negative for fever, chills, night sweats, anorexia. Cardiovascular: no chest pain or dyspnea on exertion Respiratory: no cough, shortness of breath, or wheezing Neurological: no TIA or stroke symptoms Dermatological: negative for pruritus, rash and skin lesion changes ENT: negative for - headaches, nasal discharge or sinus pain Skin: Negative. Gastrointestinal: Per history of present illness Genito-Urinary: no dysuria, trouble voiding, or hematuria Hematological and Lymphatic: negative for - bleeding problems, blood clots or bruising Breast: negative Musculoskeletal: positive for - left lower extremity slight swelling which is improving.  Remaining ROS negative.  Physical Exam: Blood pressure 115/67, pulse 105, temperature 98 F (36.7 C), temperature source Oral, resp. rate 18, height 5\' 5"  (1.651 m), weight 149 lb 3.2 oz (67.677 kg), SpO2 100.00%.  ECOG: 1  General appearance: alert, cooperative and appears stated age Head: Normocephalic, without obvious abnormality, atraumatic Neck: no adenopathy, no carotid bruit, no JVD, supple, symmetrical, trachea midline and thyroid not enlarged, symmetric, no tenderness/mass/nodules Lymph nodes: Cervical, supraclavicular, and axillary nodes normal. Heart:regular rate and rhythm, S1, S2 normal, no murmur, click, rub or gallop Lung:chest clear, no wheezing, rales, normal symmetric air entry.  Abdomin: soft, non-tender, without masses or organomegaly.No appreciable  ascites or shifting dullness. EXT: Trace edema on the left more than the right but much improved since last week.  Lab Results: Lab Results  Component Value Date   WBC 6.4 01/09/2014   HGB 10.7* 01/09/2014   HCT 32.6* 01/09/2014   MCV 93.2  01/09/2014   PLT 151 01/09/2014     Chemistry      Component Value Date/Time   NA 136 01/09/2014 1149   K 3.6 01/09/2014 1149   CO2 27 01/09/2014 1149   BUN 6.7* 01/09/2014 1149   CREATININE 0.7 01/09/2014 1149      Component Value Date/Time   CALCIUM 9.4 01/09/2014 1149   ALKPHOS 120 01/09/2014 1149   AST 30 01/09/2014 1149   ALT 44 01/09/2014 1149   BILITOT 0.40 01/09/2014 1149     Results for NINETTE, COTTA (MRN [id number]) as of 01/09/2014 13:22  Ref. Range 12/12/2013 11:51 12/19/2013 13:01 01/02/2014 11:12  CA 19-9 Latest Range: <35.0 U/mL 83931.0 (H) 54787.4 (H) 16126.6 (H)   Impression and Plan:   58 year old woman with the following issues:  1. 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 She is receiving palliative chemotherapy in the form of Gemzar and Abraxane. She tolerated chemotherapy so far without any complications. She is ready to proceed with day 8 of cycle #3. The plan is to  restage her with a CT scan after the completion of cycle 3. This will be scheduled tentatively on April 17 2  2. Abdominal pain: She is currently utilizing oxycodone with good relief. Pain has improved after the start of chemotherapy.  3. IV access: She has a Port-A-Cath in place and inserted without any complications.  4. Nausea prophylaxis: She has antiemetics prescribed and appears to be what you well.  5.Thrombosis: Acute onset left lower extremity pain and swelling. Ultrasound Doppler on 12/12/2013 confirmed the presence of deep vein thrombosis. She is currently on Lovenox without any complications and continuous improvement in her left extremity swelling. She is able to obtain Lovenox without any financial burden at this time.  6. Thrombocytopenia: resolved. Will continue with treatment if her platelet count is above 90,000.  7. Hyperglycemia: Discussed with the patient today the importance of adhering  to her medication and checking her blood sugar regularly. Prefers not to go on insulin if it's possible.  8. Followup: On 01/23/2014 for cycle 4 day 1.   [facility], MD 4/7/20151:19 PM

## 2014-01-09 NOTE — Patient Instructions (Signed)
[facility] Discharge Instructions for Patients Receiving Chemotherapy  Today you received the following chemotherapy agents Abraxane/Gemzar.  To help prevent nausea and vomiting after your treatment, we encourage you to take your nausea medication if needed.   If you develop nausea and vomiting that is not controlled by your nausea medication, call the clinic.   BELOW ARE SYMPTOMS THAT SHOULD BE REPORTED IMMEDIATELY:  *FEVER GREATER THAN 100.5 F  *CHILLS WITH OR WITHOUT FEVER  NAUSEA AND VOMITING THAT IS NOT CONTROLLED WITH YOUR NAUSEA MEDICATION  *UNUSUAL SHORTNESS OF BREATH  *UNUSUAL BRUISING OR BLEEDING  TENDERNESS IN MOUTH AND THROAT WITH OR WITHOUT PRESENCE OF ULCERS  *URINARY PROBLEMS  *BOWEL PROBLEMS  UNUSUAL RASH Items with * indicate a potential emergency and should be followed up as soon as possible.  Feel free to call the clinic you have any questions or concerns. The clinic phone number is [phone].

## 2014-01-19 ENCOUNTER — Ambulatory Visit (HOSPITAL_COMMUNITY)
Admission: RE | Admit: 2014-01-19 | Discharge: 2014-01-19 | Disposition: A | Discharge status: Home / Self Care | Payer: PRIVATE HEALTH INSURANCE | Source: Ambulatory Visit | Attending: Oncology | Admitting: Oncology

## 2014-01-19 ENCOUNTER — Encounter (HOSPITAL_COMMUNITY): Payer: Self-pay

## 2014-01-19 DIAGNOSIS — C786 Secondary malignant neoplasm of retroperitoneum and peritoneum: Secondary | ICD-10-CM | POA: Insufficient documentation

## 2014-01-19 DIAGNOSIS — C259 Malignant neoplasm of pancreas, unspecified: Secondary | ICD-10-CM | POA: Insufficient documentation

## 2014-01-19 DIAGNOSIS — E278 Other specified disorders of adrenal gland: Secondary | ICD-10-CM | POA: Insufficient documentation

## 2014-01-19 MED ORDER — IOHEXOL 300 MG/ML  SOLN
100.0000 mL | Freq: Once | INTRAMUSCULAR | Status: AC | PRN
  Administered 2014-01-19: 100 mL via INTRAVENOUS

## 2014-01-23 ENCOUNTER — Other Ambulatory Visit (HOSPITAL_BASED_OUTPATIENT_CLINIC_OR_DEPARTMENT_OTHER): Payer: PRIVATE HEALTH INSURANCE

## 2014-01-23 ENCOUNTER — Telehealth: Payer: Self-pay | Admitting: Oncology

## 2014-01-23 ENCOUNTER — Ambulatory Visit (HOSPITAL_BASED_OUTPATIENT_CLINIC_OR_DEPARTMENT_OTHER): Payer: PRIVATE HEALTH INSURANCE

## 2014-01-23 ENCOUNTER — Ambulatory Visit (HOSPITAL_BASED_OUTPATIENT_CLINIC_OR_DEPARTMENT_OTHER): Payer: PRIVATE HEALTH INSURANCE | Admitting: Oncology

## 2014-01-23 ENCOUNTER — Encounter: Payer: Self-pay | Admitting: Oncology

## 2014-01-23 ENCOUNTER — Ambulatory Visit: Payer: PRIVATE HEALTH INSURANCE | Admitting: Nutrition

## 2014-01-23 VITALS — BP 114/62 | HR 101 | Temp 98.1°F | Resp 18 | Ht 65.0 in | Wt 150.5 lb

## 2014-01-23 DIAGNOSIS — C252 Malignant neoplasm of tail of pancreas: Secondary | ICD-10-CM

## 2014-01-23 DIAGNOSIS — R109 Unspecified abdominal pain: Secondary | ICD-10-CM

## 2014-01-23 DIAGNOSIS — C259 Malignant neoplasm of pancreas, unspecified: Secondary | ICD-10-CM

## 2014-01-23 DIAGNOSIS — I82409 Acute embolism and thrombosis of unspecified deep veins of unspecified lower extremity: Secondary | ICD-10-CM

## 2014-01-23 DIAGNOSIS — C787 Secondary malignant neoplasm of liver and intrahepatic bile duct: Secondary | ICD-10-CM

## 2014-01-23 DIAGNOSIS — C786 Secondary malignant neoplasm of retroperitoneum and peritoneum: Secondary | ICD-10-CM

## 2014-01-23 DIAGNOSIS — Z5111 Encounter for antineoplastic chemotherapy: Secondary | ICD-10-CM

## 2014-01-23 LAB — CBC WITH DIFFERENTIAL/PLATELET
BASO%: 0.6 % (ref 0.0–2.0)
Basophils Absolute: 0 10*3/uL (ref 0.0–0.1)
EOS ABS: 0.2 10*3/uL (ref 0.0–0.5)
EOS%: 3.6 % (ref 0.0–7.0)
HCT: 34.3 % — ABNORMAL LOW (ref 34.8–46.6)
HGB: 11.1 g/dL — ABNORMAL LOW (ref 11.6–15.9)
LYMPH%: 23.1 % (ref 14.0–49.7)
MCH: 30.9 pg (ref 25.1–34.0)
MCHC: 32.3 g/dL (ref 31.5–36.0)
MCV: 95.4 fL (ref 79.5–101.0)
MONO#: 0.9 10*3/uL (ref 0.1–0.9)
MONO%: 17.7 % — ABNORMAL HIGH (ref 0.0–14.0)
NEUT%: 55 % (ref 38.4–76.8)
NEUTROS ABS: 2.8 10*3/uL (ref 1.5–6.5)
PLATELETS: 286 10*3/uL (ref 145–400)
RBC: 3.59 10*6/uL — ABNORMAL LOW (ref 3.70–5.45)
RDW: 21.3 % — ABNORMAL HIGH (ref 11.2–14.5)
WBC: 5.1 10*3/uL (ref 3.9–10.3)
lymph#: 1.2 10*3/uL (ref 0.9–3.3)

## 2014-01-23 LAB — COMPREHENSIVE METABOLIC PANEL (CC13)
ALT: 28 U/L (ref 0–55)
ANION GAP: 8 meq/L (ref 3–11)
AST: 18 U/L (ref 5–34)
Albumin: 2.8 g/dL — ABNORMAL LOW (ref 3.5–5.0)
Alkaline Phosphatase: 118 U/L (ref 40–150)
BILIRUBIN TOTAL: 0.3 mg/dL (ref 0.20–1.20)
BUN: 6.7 mg/dL — ABNORMAL LOW (ref 7.0–26.0)
CALCIUM: 9.4 mg/dL (ref 8.4–10.4)
CO2: 25 meq/L (ref 22–29)
Chloride: 99 mEq/L (ref 98–109)
Creatinine: 0.6 mg/dL (ref 0.6–1.1)
GLUCOSE: 285 mg/dL — AB (ref 70–140)
POTASSIUM: 4.2 meq/L (ref 3.5–5.1)
Sodium: 133 mEq/L — ABNORMAL LOW (ref 136–145)
Total Protein: 5.8 g/dL — ABNORMAL LOW (ref 6.4–8.3)

## 2014-01-23 MED ORDER — DEXAMETHASONE SODIUM PHOSPHATE 10 MG/ML IJ SOLN
INTRAMUSCULAR | Status: AC
  Filled 2014-01-23: qty 1

## 2014-01-23 MED ORDER — PACLITAXEL PROTEIN-BOUND CHEMO INJECTION 100 MG
125.0000 mg/m2 | Freq: Once | INTRAVENOUS | Status: AC
  Administered 2014-01-23: 225 mg via INTRAVENOUS
  Filled 2014-01-23: qty 45

## 2014-01-23 MED ORDER — ONDANSETRON 8 MG/NS 50 ML IVPB
INTRAVENOUS | Status: AC
  Filled 2014-01-23: qty 8

## 2014-01-23 MED ORDER — SODIUM CHLORIDE 0.9 % IV SOLN
Freq: Once | INTRAVENOUS | Status: AC
  Administered 2014-01-23: 15:00:00 via INTRAVENOUS

## 2014-01-23 MED ORDER — HEPARIN SOD (PORK) LOCK FLUSH 100 UNIT/ML IV SOLN
500.0000 [IU] | Freq: Once | INTRAVENOUS | Status: AC | PRN
  Administered 2014-01-23: 500 [IU]
  Filled 2014-01-23: qty 5

## 2014-01-23 MED ORDER — ONDANSETRON 8 MG/50ML IVPB (CHCC)
8.0000 mg | Freq: Once | INTRAVENOUS | Status: AC
  Administered 2014-01-23: 8 mg via INTRAVENOUS

## 2014-01-23 MED ORDER — SODIUM CHLORIDE 0.9 % IJ SOLN
10.0000 mL | INTRAMUSCULAR | Status: DC | PRN
  Administered 2014-01-23: 10 mL
  Filled 2014-01-23: qty 10

## 2014-01-23 MED ORDER — SODIUM CHLORIDE 0.9 % IV SOLN
1000.0000 mg/m2 | Freq: Once | INTRAVENOUS | Status: AC
  Administered 2014-01-23: 1862 mg via INTRAVENOUS
  Filled 2014-01-23: qty 48.97

## 2014-01-23 MED ORDER — DEXAMETHASONE SODIUM PHOSPHATE 10 MG/ML IJ SOLN
10.0000 mg | Freq: Once | INTRAMUSCULAR | Status: AC
  Administered 2014-01-23: 10 mg via INTRAVENOUS

## 2014-01-23 NOTE — Progress Notes (Signed)
Hematology and Oncology Follow Up Visit  Christine Gould [id number] 1956-05-20 58 y.o. 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 This was diagnosed in February of 2015. Her CA 19-9 was 37,431.  Prior Therapy: She is status post endoscopic ultrasound and a biopsy completed on 11/07/2013 confirmed the presence of adenocarcinoma.  Current therapy: She is receiving palliative chemotherapy in the form of Gemzar and Abraxane given on day 1, day 8 of 21 day cycle. Today is day 1 of cycle 4 of therapy.  Secondary diagnosis: She developed left deep vein thrombosis on 12/12/2013 and have been on Lovenox since.  Interim History:  Christine Gould presents today for a followup visit. She is accompanied by her daughter today prior to the administration of day 1 of cycle 4 of chemotherapy. She tolerated the last treatment without any complications. She is reporting a decrease pain and swelling in her left leg. She has tolerated on Lovenox without any complications. She has no problem giving herself the injections and have not reported any pain or injection-related problems. She did not report any nausea or vomiting. She does have some occasional  abdominal pain that is well-controlled with her oxycodone tablets.  Denies hematochezia or melena.She denied any peripheral neuropathy or decline in her performance status. She continues to report grade 1 fatigue. She is reporting a decrease in her abdominal distention and less abdominal pain since the start of chemotherapy. She reports she monitors her blood sugars but not very frequently. She drinks a lot of fluid but a lot of it has lots of sugar in them. She has not reported any alteration of mental status with confusion. She had not reported any  syncope or any other complications. She has not reported any dizziness or seizure activity.  Medications: I have reviewed the patient's current medications.  Current Outpatient Prescriptions  Medication Sig Dispense Refill  . acetaminophen (TYLENOL) 500 MG tablet Take 1,000 mg by mouth every 6 (six) hours as needed for mild pain or moderate pain.      Marland Kitchen ALPRAZolam (XANAX) 0.5 MG tablet       . amLODipine (NORVASC) 10 MG tablet Take 10 mg by mouth every morning.      . citalopram (CELEXA) 20 MG tablet Take 20 mg by mouth every morning.      . enoxaparin (LOVENOX) 100 MG/ML injection Inject 1.1 mLs (110 mg total) into the skin daily.  90 Syringe  1  . glyBURIDE (DIABETA) 5 MG tablet Take 5 mg by mouth daily with breakfast.      . hydrochlorothiazide (HYDRODIURIL) 25 MG tablet Take 25 mg by mouth every morning.       Marland Kitchen HYDROcodone-acetaminophen (NORCO/VICODIN) 5-325 MG per tablet Take 1 tablet by mouth every 6 (six) hours as needed for moderate pain.       Marland Kitchen ibuprofen (ADVIL,MOTRIN) 200 MG tablet Take 400 mg by mouth every 6 (six) hours as needed for mild pain or moderate pain.      Marland Kitchen lidocaine-prilocaine (EMLA) cream Apply 1 application topically daily as needed (port).      . metFORMIN (GLUCOPHAGE) 1000 MG tablet Take 1,000 mg by mouth 2 (two) times daily with a meal.      . Multiple Vitamin (MULTIVITAMIN WITH MINERALS) TABS tablet Take 1 tablet by mouth daily.      Marland Kitchen  olmesartan-hydrochlorothiazide (BENICAR HCT) 40-25 MG per tablet Take 1 tablet by mouth every morning.      Marland Kitchen oxyCODONE (OXY IR/ROXICODONE) 5 MG immediate release tablet Take 1 tablet (5 mg total) by mouth every 4 (four) hours as needed for severe pain.  30 tablet  0  . prochlorperazine (COMPAZINE) 10 MG tablet Take 10 mg by mouth every 6 (six) hours as needed for nausea or vomiting.      . traZODone (DESYREL) 100 MG tablet Take 100 mg by mouth at bedtime.       No current facility-administered medications for this visit.      Allergies:  Allergies  Allergen Reactions  . Statins Other (See Comments)    Cramps-fatigue  . Toprol Xl [Metoprolol] Other (See Comments)    Pain     Past Medical History, Surgical history, Social history, and Family History were reviewed and updated.  Review of Systems: Constitutional:  Negative for fever, chills, night sweats, anorexia. Cardiovascular: no chest pain or dyspnea on exertion Respiratory: no cough, shortness of breath, or wheezing Neurological: no TIA or stroke symptoms Dermatological: negative for pruritus, rash and skin lesion changes ENT: negative for - headaches, nasal discharge or sinus pain Skin: Negative. Gastrointestinal: Per history of present illness Genito-Urinary: no dysuria, trouble voiding, or hematuria Hematological and Lymphatic: negative for - bleeding problems, blood clots or bruising Breast: negative Musculoskeletal: positive for - left lower extremity slight swelling which is improving.  Remaining ROS negative.  Physical Exam: Blood pressure 114/62, pulse 101, temperature 98.1 F (36.7 C), temperature source Oral, resp. rate 18, height 5\' 5"  (1.651 m), weight 150 lb 8 oz (68.266 kg).  ECOG: 1  General appearance: alert, cooperative and appears stated age Head: Normocephalic, without obvious abnormality, atraumatic Neck: no adenopathy, no carotid bruit, no JVD, supple, symmetrical, trachea midline and thyroid not enlarged, symmetric, no tenderness/mass/nodules Lymph nodes: Cervical, supraclavicular, and axillary nodes normal. Heart:regular rate and rhythm, S1, S2 normal, no murmur, click, rub or gallop Lung:chest clear, no wheezing, rales, normal symmetric air entry.  Abdomin: soft, non-tender, without masses or organomegaly.No appreciable  ascites or shifting dullness. EXT: Trace edema on the left more than the right but much improved since last week.  Lab Results: Lab Results  Component Value Date   WBC 5.1 01/23/2014   HGB  11.1* 01/23/2014   HCT 34.3* 01/23/2014   MCV 95.4 01/23/2014   PLT 286 01/23/2014     Chemistry      Component Value Date/Time   NA 136 01/09/2014 1149   K 3.6 01/09/2014 1149   CO2 27 01/09/2014 1149   BUN 6.7* 01/09/2014 1149   CREATININE 0.7 01/09/2014 1149      Component Value Date/Time   CALCIUM 9.4 01/09/2014 1149   ALKPHOS 120 01/09/2014 1149   AST 30 01/09/2014 1149   ALT 44 01/09/2014 1149   BILITOT 0.40 01/09/2014 1149     CT CHEST, ABDOMEN, AND PELVIS WITH CONTRAST  TECHNIQUE:  Multidetector CT imaging of the chest, abdomen and pelvis was  performed following the standard protocol during bolus  administration of intravenous contrast.  CONTRAST: 140mL OMNIPAQUE IOHEXOL 300 MG/ML SOLN  COMPARISON: None.  FINDINGS:  CT CHEST FINDINGS  No axillary or supraclavicular lymphadenopathy. Port in the right  anterior chest wall. No mediastinal or hilar lymphadenopathy. No  central pulmonary embolism. No pericardial fluid. Esophagus is  normal.  Review of the lung parenchyma demonstrates no suspicious pulmonary  nodules. Airways are normal.  CT ABDOMEN  AND PELVIS FINDINGS  No focal hepatic lesion. The head and body of the pancreas are  normal. Mass lesion in the tail the pancreas is decreased in volume  measuring 44 x 29 mm compared to 54 x 40 mm. Marked reduction in  volume of the peritoneal carcinomatosis. Largest focus of omental  caking in the left anterior abdomen measures 39 x 14 mm compared to  84 x 26 mm on prior. Adjacent ventral peritoneal nodule measures 9  mm (image 71, series 4) decreased from 18 mm on prior.  Along the inferior liver margin adjacent to falciform ligament  nodular implant measures 10 mm decreased from 22 mm on prior.  Enlarged gastrohepatic ligament lymph node is decreased  significantly measuring 7 mm decreased from 23 mm on prior. No new  metastasis are identified.  The spleen is normal. There is nodular thickening of the medial limb  of the left adrenal  gland to 10 mm not changed. Right adrenal gland  is normal. No renal metastasis.  The stomach, small bowel, appendix, cecum normal. The colon and  rectosigmoid colon are normal.  Abdominal aorta is normal caliber. Small periaortic retroperitoneal  lymph nodes are similar prior.  No free fluid the pelvis. The uterus and ovaries are normal. No  pelvic lymphadenopathy. No aggressive osseous lesion.  IMPRESSION:  1. No evidence of thoracic metastasis.  2. Interval decrease in volume of the pancreatic tail carcinoma.  3. Marked interval decrease in volume of peritoneal metastatic  implants.  4. Stable mild thickening of the left adrenal gland.  5. No liver metastasis.    Impression and Plan:   58 year old woman with the following issues:   1. 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 She is receiving palliative chemotherapy in the form of Gemzar and Abraxane. She tolerated chemotherapy so far without any complications. CT scan from 01/19/2014 was discussed and showed a dramatic response to systemic chemotherapy. Her pancreatic lesion as well as peritoneal disease have decreased dramatically. Plan is to continue for at least 6 cycles of chemotherapy.  She is ready to proceed with day 1 of cycle #4.   2. Abdominal pain: She is currently utilizing oxycodone with good relief. Pain has improved after the start of chemotherapy.  3. IV access: She has a Port-A-Cath in place and inserted without any complications.  4. Nausea prophylaxis: She has antiemetics prescribed and appears to be what you well.  5.Thrombosis: Acute onset left lower extremity pain and swelling. Ultrasound Doppler on 12/12/2013 confirmed the presence of deep vein thrombosis. She is currently on Lovenox without any complications and continuous improvement in her left extremity swelling. She is able to obtain Lovenox  without any financial burden at this time.  6. Thrombocytopenia: resolved. Will continue with treatment if her platelet count is above 90,000.  7. Hyperglycemia: Discussed with the patient today the importance of adhering to her medication and checking her blood sugar regularly. Prefers not to go on insulin if it's possible.  8. Followup: On 01/30/2013 for day 8 of cycle 4. She'll also have a clinical visit on 02/13/2014 for day 1 of cycle 5.   Wyatt Portela, MD 4/21/20151:41 PM

## 2014-01-23 NOTE — Telephone Encounter (Signed)
gv adn printed appt sched and avs for pt or April and May....Marland Kitchensed added tx.

## 2014-01-23 NOTE — Progress Notes (Signed)
58 year old female diagnosed with stage IV pancreas cancer in February 2015.  Patient has hepatic metastases and peritoneal carcinomatosis.  She is a patient of Dr. Alen Blew.  Past medical history includes hypertension, depression, diabetes, and DVT.  Medications include Xanax, Celexa, DiaBeta, Glucophage, multivitamin, and Compazine.  Labs include glucose 474, BUN 6.7, albumin 2.8 on April seventh.  Height: 65 inches. Weight: 150.5 pounds. Usual body weight: 160 pounds in February 2015. BMI: 25.04.  Patient reports poor appetite with no desire to eat.  She does consume lots of juices and other sugar concentrated liquids.  She has no energy to prepare shakes or smoothies.  Patient understands that juices and high sugar drinks are causing increased blood sugars.  She reports these are things she desires.  Patient also complains of constipation.  Nausea is controlled with medications.  Weight down approximately 10 pounds over the past 2 months.  Nutrition diagnosis: Unintended weight loss related to poor appetite as evidenced by 10 pound weight loss since February 2015.  Intervention: Patient educated to consume small, frequent meals and snacks every 2 hours throughout the day.  Recommended patient decrease juices and concentrated sugar drinks to no more than 4 ounces daily and be sure she drinks than with meals.  Recommended high-calorie, high-protein foods throughout the day.  Educated patient on strategies for improving constipation.  Encouraged gradual intake of fiber-containing foods.  Fact sheets were provided.  Teach back method used.  Questions were answered.  Monitoring, evaluation, goals: Patient will tolerate adequate calories and protein for improved glycemic control and weight maintenance.  Next visit: Tuesday, May 12, during chemotherapy.

## 2014-01-23 NOTE — Patient Instructions (Signed)
[facility] Discharge Instructions for Patients Receiving Chemotherapy  Today you received the following chemotherapy agents Abraxane and Gemzar.  To help prevent nausea and vomiting after your treatment, we encourage you to take your nausea medication.   If you develop nausea and vomiting that is not controlled by your nausea medication, call the clinic.   BELOW ARE SYMPTOMS THAT SHOULD BE REPORTED IMMEDIATELY:  *FEVER GREATER THAN 100.5 F  *CHILLS WITH OR WITHOUT FEVER  NAUSEA AND VOMITING THAT IS NOT CONTROLLED WITH YOUR NAUSEA MEDICATION  *UNUSUAL SHORTNESS OF BREATH  *UNUSUAL BRUISING OR BLEEDING  TENDERNESS IN MOUTH AND THROAT WITH OR WITHOUT PRESENCE OF ULCERS  *URINARY PROBLEMS  *BOWEL PROBLEMS  UNUSUAL RASH Items with * indicate a potential emergency and should be followed up as soon as possible.  Feel free to call the clinic you have any questions or concerns. The clinic phone number is [phone].    

## 2014-01-24 LAB — CANCER ANTIGEN 19-9: CA 19 9: 2694.8 U/mL — AB (ref <35.0)

## 2014-01-29 ENCOUNTER — Telehealth: Payer: Self-pay | Admitting: *Deleted

## 2014-01-29 NOTE — Telephone Encounter (Signed)
Patient called to see if we can move her appts tomorrow to early time. I have called the patient and explained I have no appts

## 2014-01-30 ENCOUNTER — Other Ambulatory Visit (HOSPITAL_BASED_OUTPATIENT_CLINIC_OR_DEPARTMENT_OTHER): Payer: PRIVATE HEALTH INSURANCE

## 2014-01-30 ENCOUNTER — Ambulatory Visit (HOSPITAL_BASED_OUTPATIENT_CLINIC_OR_DEPARTMENT_OTHER): Payer: PRIVATE HEALTH INSURANCE

## 2014-01-30 VITALS — BP 98/69 | HR 99 | Temp 97.1°F | Resp 20

## 2014-01-30 DIAGNOSIS — C787 Secondary malignant neoplasm of liver and intrahepatic bile duct: Secondary | ICD-10-CM

## 2014-01-30 DIAGNOSIS — C252 Malignant neoplasm of tail of pancreas: Secondary | ICD-10-CM

## 2014-01-30 DIAGNOSIS — Z5111 Encounter for antineoplastic chemotherapy: Secondary | ICD-10-CM

## 2014-01-30 DIAGNOSIS — C259 Malignant neoplasm of pancreas, unspecified: Secondary | ICD-10-CM

## 2014-01-30 DIAGNOSIS — C786 Secondary malignant neoplasm of retroperitoneum and peritoneum: Secondary | ICD-10-CM

## 2014-01-30 LAB — CBC WITH DIFFERENTIAL/PLATELET
BASO%: 0.4 % (ref 0.0–2.0)
Basophils Absolute: 0 10*3/uL (ref 0.0–0.1)
EOS%: 0.2 % (ref 0.0–7.0)
Eosinophils Absolute: 0 10*3/uL (ref 0.0–0.5)
HEMATOCRIT: 32.6 % — AB (ref 34.8–46.6)
HGB: 10.8 g/dL — ABNORMAL LOW (ref 11.6–15.9)
LYMPH%: 28.8 % (ref 14.0–49.7)
MCH: 31.2 pg (ref 25.1–34.0)
MCHC: 33.1 g/dL (ref 31.5–36.0)
MCV: 94.2 fL (ref 79.5–101.0)
MONO#: 0.3 10*3/uL (ref 0.1–0.9)
MONO%: 6.4 % (ref 0.0–14.0)
NEUT#: 2.7 10*3/uL (ref 1.5–6.5)
NEUT%: 64.2 % (ref 38.4–76.8)
PLATELETS: 211 10*3/uL (ref 145–400)
RBC: 3.45 10*6/uL — ABNORMAL LOW (ref 3.70–5.45)
RDW: 20 % — ABNORMAL HIGH (ref 11.2–14.5)
WBC: 4.2 10*3/uL (ref 3.9–10.3)
lymph#: 1.2 10*3/uL (ref 0.9–3.3)

## 2014-01-30 LAB — COMPREHENSIVE METABOLIC PANEL (CC13)
ALBUMIN: 3 g/dL — AB (ref 3.5–5.0)
ALT: 43 U/L (ref 0–55)
AST: 36 U/L — AB (ref 5–34)
Alkaline Phosphatase: 96 U/L (ref 40–150)
Anion Gap: 13 mEq/L — ABNORMAL HIGH (ref 3–11)
BUN: 8.9 mg/dL (ref 7.0–26.0)
CALCIUM: 10.1 mg/dL (ref 8.4–10.4)
CHLORIDE: 98 meq/L (ref 98–109)
CO2: 24 mEq/L (ref 22–29)
Creatinine: 0.8 mg/dL (ref 0.6–1.1)
GLUCOSE: 250 mg/dL — AB (ref 70–140)
POTASSIUM: 3.9 meq/L (ref 3.5–5.1)
SODIUM: 135 meq/L — AB (ref 136–145)
TOTAL PROTEIN: 6.2 g/dL — AB (ref 6.4–8.3)
Total Bilirubin: 0.31 mg/dL (ref 0.20–1.20)

## 2014-01-30 MED ORDER — HEPARIN SOD (PORK) LOCK FLUSH 100 UNIT/ML IV SOLN
500.0000 [IU] | Freq: Once | INTRAVENOUS | Status: AC | PRN
  Administered 2014-01-30: 500 [IU]
  Filled 2014-01-30: qty 5

## 2014-01-30 MED ORDER — DEXAMETHASONE SODIUM PHOSPHATE 10 MG/ML IJ SOLN
10.0000 mg | Freq: Once | INTRAMUSCULAR | Status: AC
  Administered 2014-01-30: 10 mg via INTRAVENOUS

## 2014-01-30 MED ORDER — SODIUM CHLORIDE 0.9 % IV SOLN
Freq: Once | INTRAVENOUS | Status: AC
  Administered 2014-01-30: 15:00:00 via INTRAVENOUS

## 2014-01-30 MED ORDER — SODIUM CHLORIDE 0.9 % IJ SOLN
10.0000 mL | INTRAMUSCULAR | Status: DC | PRN
  Administered 2014-01-30: 10 mL
  Filled 2014-01-30: qty 10

## 2014-01-30 MED ORDER — ONDANSETRON 8 MG/50ML IVPB (CHCC)
8.0000 mg | Freq: Once | INTRAVENOUS | Status: AC
  Administered 2014-01-30: 8 mg via INTRAVENOUS

## 2014-01-30 MED ORDER — PACLITAXEL PROTEIN-BOUND CHEMO INJECTION 100 MG
125.0000 mg/m2 | Freq: Once | INTRAVENOUS | Status: AC
  Administered 2014-01-30: 225 mg via INTRAVENOUS
  Filled 2014-01-30: qty 45

## 2014-01-30 MED ORDER — SODIUM CHLORIDE 0.9 % IV SOLN
1000.0000 mg/m2 | Freq: Once | INTRAVENOUS | Status: AC
  Administered 2014-01-30: 1862 mg via INTRAVENOUS
  Filled 2014-01-30: qty 48.97

## 2014-01-30 NOTE — Patient Instructions (Signed)
[facility] Discharge Instructions for Patients Receiving Chemotherapy  Today you received the following chemotherapy agents Abraxane and Gemzar  To help prevent nausea and vomiting after your treatment, we encourage you to take your nausea medication as needed   If you develop nausea and vomiting that is not controlled by your nausea medication, call the clinic.   BELOW ARE SYMPTOMS THAT SHOULD BE REPORTED IMMEDIATELY:  *FEVER GREATER THAN 100.5 F  *CHILLS WITH OR WITHOUT FEVER  NAUSEA AND VOMITING THAT IS NOT CONTROLLED WITH YOUR NAUSEA MEDICATION  *UNUSUAL SHORTNESS OF BREATH  *UNUSUAL BRUISING OR BLEEDING  TENDERNESS IN MOUTH AND THROAT WITH OR WITHOUT PRESENCE OF ULCERS  *URINARY PROBLEMS  *BOWEL PROBLEMS  UNUSUAL RASH Items with * indicate a potential emergency and should be followed up as soon as possible.  Do not take Benicar. Please monitor you blood pressure daily and record for next visit.  Feel free to call the clinic you have any questions or concerns. The clinic phone number is [phone].

## 2014-01-30 NOTE — Progress Notes (Signed)
Pt reported falling once on Sunday and once on Monday. BP 98/69. Pt reports she has continuing hx of dizziness. She takes norvasc and benicar. Dr Alen Blew informed. Order to hold benicar until next visit received. Also for pt to monitor her BP at home and record it daily.

## 2014-02-13 ENCOUNTER — Ambulatory Visit (HOSPITAL_BASED_OUTPATIENT_CLINIC_OR_DEPARTMENT_OTHER): Payer: PRIVATE HEALTH INSURANCE | Admitting: Physician Assistant

## 2014-02-13 ENCOUNTER — Ambulatory Visit: Payer: PRIVATE HEALTH INSURANCE | Admitting: Nutrition

## 2014-02-13 ENCOUNTER — Other Ambulatory Visit (HOSPITAL_BASED_OUTPATIENT_CLINIC_OR_DEPARTMENT_OTHER): Payer: PRIVATE HEALTH INSURANCE

## 2014-02-13 ENCOUNTER — Ambulatory Visit (HOSPITAL_BASED_OUTPATIENT_CLINIC_OR_DEPARTMENT_OTHER): Payer: PRIVATE HEALTH INSURANCE

## 2014-02-13 ENCOUNTER — Encounter: Payer: Self-pay | Admitting: Physician Assistant

## 2014-02-13 ENCOUNTER — Telehealth: Payer: Self-pay | Admitting: Oncology

## 2014-02-13 VITALS — BP 112/75 | HR 103 | Temp 98.8°F | Resp 20 | Ht 65.0 in | Wt 150.1 lb

## 2014-02-13 DIAGNOSIS — C786 Secondary malignant neoplasm of retroperitoneum and peritoneum: Secondary | ICD-10-CM

## 2014-02-13 DIAGNOSIS — C787 Secondary malignant neoplasm of liver and intrahepatic bile duct: Secondary | ICD-10-CM

## 2014-02-13 DIAGNOSIS — C252 Malignant neoplasm of tail of pancreas: Secondary | ICD-10-CM

## 2014-02-13 DIAGNOSIS — R109 Unspecified abdominal pain: Secondary | ICD-10-CM

## 2014-02-13 DIAGNOSIS — I82409 Acute embolism and thrombosis of unspecified deep veins of unspecified lower extremity: Secondary | ICD-10-CM

## 2014-02-13 DIAGNOSIS — C259 Malignant neoplasm of pancreas, unspecified: Secondary | ICD-10-CM

## 2014-02-13 DIAGNOSIS — Z5111 Encounter for antineoplastic chemotherapy: Secondary | ICD-10-CM

## 2014-02-13 LAB — COMPREHENSIVE METABOLIC PANEL (CC13)
ALK PHOS: 94 U/L (ref 40–150)
ALT: 30 U/L (ref 0–55)
ANION GAP: 13 meq/L — AB (ref 3–11)
AST: 23 U/L (ref 5–34)
Albumin: 2.8 g/dL — ABNORMAL LOW (ref 3.5–5.0)
BILIRUBIN TOTAL: 0.3 mg/dL (ref 0.20–1.20)
BUN: 4.5 mg/dL — ABNORMAL LOW (ref 7.0–26.0)
CO2: 20 meq/L — AB (ref 22–29)
CREATININE: 0.6 mg/dL (ref 0.6–1.1)
Calcium: 8.7 mg/dL (ref 8.4–10.4)
Chloride: 107 mEq/L (ref 98–109)
Glucose: 245 mg/dl — ABNORMAL HIGH (ref 70–140)
Potassium: 3.6 mEq/L (ref 3.5–5.1)
SODIUM: 140 meq/L (ref 136–145)
Total Protein: 5.5 g/dL — ABNORMAL LOW (ref 6.4–8.3)

## 2014-02-13 LAB — CBC WITH DIFFERENTIAL/PLATELET
BASO%: 0.7 % (ref 0.0–2.0)
Basophils Absolute: 0 10*3/uL (ref 0.0–0.1)
EOS%: 3.9 % (ref 0.0–7.0)
Eosinophils Absolute: 0.1 10*3/uL (ref 0.0–0.5)
HCT: 31.7 % — ABNORMAL LOW (ref 34.8–46.6)
HGB: 10.3 g/dL — ABNORMAL LOW (ref 11.6–15.9)
LYMPH%: 34.9 % (ref 14.0–49.7)
MCH: 31.4 pg (ref 25.1–34.0)
MCHC: 32.4 g/dL (ref 31.5–36.0)
MCV: 97 fL (ref 79.5–101.0)
MONO#: 0.7 10*3/uL (ref 0.1–0.9)
MONO%: 19.8 % — AB (ref 0.0–14.0)
NEUT#: 1.4 10*3/uL — ABNORMAL LOW (ref 1.5–6.5)
NEUT%: 40.7 % (ref 38.4–76.8)
Platelets: 308 10*3/uL (ref 145–400)
RBC: 3.27 10*6/uL — AB (ref 3.70–5.45)
RDW: 21 % — ABNORMAL HIGH (ref 11.2–14.5)
WBC: 3.3 10*3/uL — ABNORMAL LOW (ref 3.9–10.3)
lymph#: 1.2 10*3/uL (ref 0.9–3.3)

## 2014-02-13 LAB — CA 125: CA 125: 14.7 U/mL (ref 0.0–30.2)

## 2014-02-13 MED ORDER — ONDANSETRON 8 MG/NS 50 ML IVPB
INTRAVENOUS | Status: AC
  Filled 2014-02-13: qty 8

## 2014-02-13 MED ORDER — HEPARIN SOD (PORK) LOCK FLUSH 100 UNIT/ML IV SOLN
500.0000 [IU] | Freq: Once | INTRAVENOUS | Status: AC | PRN
  Administered 2014-02-13: 500 [IU]
  Filled 2014-02-13: qty 5

## 2014-02-13 MED ORDER — SODIUM CHLORIDE 0.9 % IV SOLN
Freq: Once | INTRAVENOUS | Status: AC
  Administered 2014-02-13: 12:00:00 via INTRAVENOUS

## 2014-02-13 MED ORDER — DEXAMETHASONE SODIUM PHOSPHATE 10 MG/ML IJ SOLN
10.0000 mg | Freq: Once | INTRAMUSCULAR | Status: AC
  Administered 2014-02-13: 10 mg via INTRAVENOUS

## 2014-02-13 MED ORDER — PACLITAXEL PROTEIN-BOUND CHEMO INJECTION 100 MG
125.0000 mg/m2 | Freq: Once | INTRAVENOUS | Status: AC
  Administered 2014-02-13: 225 mg via INTRAVENOUS
  Filled 2014-02-13: qty 45

## 2014-02-13 MED ORDER — ONDANSETRON 8 MG/50ML IVPB (CHCC)
8.0000 mg | Freq: Once | INTRAVENOUS | Status: AC
  Administered 2014-02-13: 8 mg via INTRAVENOUS

## 2014-02-13 MED ORDER — DEXAMETHASONE SODIUM PHOSPHATE 10 MG/ML IJ SOLN
INTRAMUSCULAR | Status: AC
  Filled 2014-02-13: qty 1

## 2014-02-13 MED ORDER — SODIUM CHLORIDE 0.9 % IJ SOLN
10.0000 mL | INTRAMUSCULAR | Status: DC | PRN
  Administered 2014-02-13: 10 mL
  Filled 2014-02-13: qty 10

## 2014-02-13 MED ORDER — SODIUM CHLORIDE 0.9 % IV SOLN
1000.0000 mg/m2 | Freq: Once | INTRAVENOUS | Status: AC
  Administered 2014-02-13: 1862 mg via INTRAVENOUS
  Filled 2014-02-13: qty 48.97

## 2014-02-13 NOTE — Patient Instructions (Signed)
Continue labs and chemotherapy as scheduled Followup in 3 weeks at the beginning of your next scheduled cycle of chemotherapy Continue to take your diabetes medications as prescribed and to check your blood sugars frequently

## 2014-02-13 NOTE — Telephone Encounter (Signed)
gv and printed appt sched and avs for pt for May and June....sed added tx. °

## 2014-02-13 NOTE — Progress Notes (Signed)
Patient reports she is feeling better and her blood sugars are better controlled.  Noted glucose 245.  Weight is stable and was documented as 150 pounds.  Patient reports she is eating better than she was.  She has no nutrition concerns.  Nutrition diagnosis: Unintended weight loss resolved.  Encouraged patient to contact me with further questions or concerns.  Enforced the importance of small, frequent meals and consuming protein, containing foods.  Patient has my contact information and will call me with questions or concerns.

## 2014-02-13 NOTE — Progress Notes (Signed)
Hematology and Oncology Follow Up Visit  Christine Gould [id number] [birth year] 58 y.o. 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 This was diagnosed in February of 2015. Her CA 19-9 was 37,431.  Prior Therapy: She is status post endoscopic ultrasound and a biopsy completed on 11/07/2013 confirmed the presence of adenocarcinoma.  Current therapy: She is receiving palliative chemotherapy in the form of Gemzar and Abraxane given on day 1, day 8 of 21 day cycle. Status post 4 cycles Today is day 1 of cycle 5 of therapy.  Secondary diagnosis: She developed left deep vein thrombosis on 12/12/2013 and has been on Lovenox since.  Interim History:  Christine Gould presents today for a followup visit. She is accompanied by her husband today prior to the administration of day 1 of cycle 5 of chemotherapy. She tolerated the last treatment without any complications. She does have some occasional nausea but does not require her to take her antiemetics medication. She's been having issues with constipation however this resolved on its own when she stopped taking her multivitamin. 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 She is not dropping any items, is still able to button her close right her name legibly. She continues to have some swelling in her left leg however this is diminished and she is no longer experiencing pain. She is reporting a decrease pain and swelling in her left leg. She has tolerated on Lovenox without any complications. She has no problem giving herself the injections and have not reported any pain or injection-related problems. She denies any vomiting.  Denies hematochezia or  melena.She denied any decline in her performance status. She continues to report grade 1 fatigue. She is reporting a decrease in her abdominal distention and less abdominal pain since the start of chemotherapy.She does have some occasional  abdominal pain that is well-controlled with her oxycodone tablets.  She denied any alteration of mental status with confusion. She had not reported any syncope or any other complications. She has not reported any dizziness or seizure activity.  Medications: I have reviewed the patient's current medications.  Current Outpatient Prescriptions  Medication Sig Dispense Refill  . acetaminophen (TYLENOL) 500 MG tablet Take 1,000 mg by mouth every 6 (six) hours as needed for mild pain or moderate pain.      Marland Kitchen ALPRAZolam (XANAX) 0.5 MG tablet       . amLODipine (NORVASC) 10 MG tablet Take 10 mg by mouth every morning.      . citalopram (CELEXA) 20 MG tablet Take 20 mg by mouth every morning.      . enoxaparin (LOVENOX) 100 MG/ML injection Inject 1.1 mLs (110 mg total) into the skin daily.  90 Syringe  1  . glyBURIDE (DIABETA) 5 MG tablet Take 5 mg by mouth daily with breakfast.      . ibuprofen (ADVIL,MOTRIN) 200 MG tablet Take 400 mg by mouth every 6 (six) hours as needed for mild pain or moderate pain.      Marland Kitchen lidocaine-prilocaine (EMLA) cream Apply 1 application topically daily as needed (port).      . metFORMIN (GLUCOPHAGE) 1000 MG tablet Take 1,000 mg by mouth 2 (two) times daily with a meal.      .  olmesartan-hydrochlorothiazide (BENICAR HCT) 40-25 MG per tablet Take 1 tablet by mouth every morning.      Marland Kitchen oxyCODONE (OXY IR/ROXICODONE) 5 MG immediate release tablet Take 1 tablet (5 mg total) by mouth every 4 (four) hours as needed for severe pain.  30 tablet  0  . prochlorperazine (COMPAZINE) 10 MG tablet Take 10 mg by mouth every 6 (six) hours as needed for nausea or vomiting.      . traZODone (DESYREL) 100 MG tablet Take 100 mg by mouth at bedtime.       No  current facility-administered medications for this visit.     Allergies:  Allergies  Allergen Reactions  . Statins Other (See Comments)    Cramps-fatigue  . Toprol Xl [Metoprolol] Other (See Comments)    Pain     Past Medical History, Surgical history, Social history, and Family History were reviewed and updated.  Review of Systems: Constitutional:  Negative for fever, chills, night sweats, anorexia. Cardiovascular: no chest pain or dyspnea on exertion Respiratory: no cough, shortness of breath, or wheezing Neurological: no TIA or stroke symptoms Dermatological: negative for pruritus, rash and skin lesion changes ENT: negative for - headaches, nasal discharge or sinus pain Skin: Negative. Gastrointestinal: Per history of present illness Genito-Urinary: no dysuria, trouble voiding, or hematuria Hematological and Lymphatic: negative for - bleeding problems, blood clots or bruising Breast: negative Musculoskeletal: positive for - left lower extremity slight swelling which is improving.  Remaining ROS negative.  Physical Exam: Blood pressure 112/75, pulse 103, temperature 98.8 F (37.1 C), temperature source Oral, resp. rate 20, height 5\' 5"  (1.651 m), weight 150 lb 1.6 oz (68.085 kg).  ECOG: 1  General appearance: alert, cooperative and appears stated age Head: Normocephalic, without obvious abnormality, atraumatic Neck: no adenopathy, no carotid bruit, no JVD, supple, symmetrical, trachea midline and thyroid not enlarged, symmetric, no tenderness/mass/nodules Lymph nodes: Cervical, supraclavicular, and axillary nodes normal. Heart:regular rate and rhythm, S1, S2 normal, no murmur, click, rub or gallop Lung:chest clear, no wheezing, rales, normal symmetric air entry.  Abdomin: soft, non-tender, without masses or organomegaly.No appreciable  ascites or shifting dullness. EXT: 1+ edema on the left, trace on the right   Lab Results: Lab Results  Component Value Date   WBC  3.3* 02/13/2014   HGB 10.3* 02/13/2014   HCT 31.7* 02/13/2014   MCV 97.0 02/13/2014   PLT 308 02/13/2014     Chemistry      Component Value Date/Time   NA 140 02/13/2014 0937   K 3.6 02/13/2014 0937   CO2 20* 02/13/2014 0937   BUN 4.5* 02/13/2014 0937   CREATININE 0.6 02/13/2014 0937      Component Value Date/Time   CALCIUM 8.7 02/13/2014 0937   ALKPHOS 94 02/13/2014 0937   AST 23 02/13/2014 0937   ALT 30 02/13/2014 0937   BILITOT 0.30 02/13/2014 0937     CT CHEST, ABDOMEN, AND PELVIS WITH CONTRAST  TECHNIQUE:  Multidetector CT imaging of the chest, abdomen and pelvis was  performed following the standard protocol during bolus  administration of intravenous contrast.  CONTRAST: 183mL OMNIPAQUE IOHEXOL 300 MG/ML SOLN  COMPARISON: None.  FINDINGS:  CT CHEST FINDINGS  No axillary or supraclavicular lymphadenopathy. Port in the right  anterior chest wall. No mediastinal or hilar lymphadenopathy. No  central pulmonary embolism. No pericardial fluid. Esophagus is  normal.  Review of the lung parenchyma demonstrates no suspicious pulmonary  nodules. Airways are normal.  CT ABDOMEN AND PELVIS FINDINGS  No  focal hepatic lesion. The head and body of the pancreas are  normal. Mass lesion in the tail the pancreas is decreased in volume  measuring 44 x 29 mm compared to 54 x 40 mm. Marked reduction in  volume of the peritoneal carcinomatosis. Largest focus of omental  caking in the left anterior abdomen measures 39 x 14 mm compared to  84 x 26 mm on prior. Adjacent ventral peritoneal nodule measures 9  mm (image 71, series 4) decreased from 18 mm on prior.  Along the inferior liver margin adjacent to falciform ligament  nodular implant measures 10 mm decreased from 22 mm on prior.  Enlarged gastrohepatic ligament lymph node is decreased  significantly measuring 7 mm decreased from 23 mm on prior. No new  metastasis are identified.  The spleen is normal. There is nodular thickening of the  medial limb  of the left adrenal gland to 10 mm not changed. Right adrenal gland  is normal. No renal metastasis.  The stomach, small bowel, appendix, cecum normal. The colon and  rectosigmoid colon are normal.  Abdominal aorta is normal caliber. Small periaortic retroperitoneal  lymph nodes are similar prior.  No free fluid the pelvis. The uterus and ovaries are normal. No  pelvic lymphadenopathy. No aggressive osseous lesion.  IMPRESSION:  1. No evidence of thoracic metastasis.  2. Interval decrease in volume of the pancreatic tail carcinoma.  3. Marked interval decrease in volume of peritoneal metastatic  implants.  4. Stable mild thickening of the left adrenal gland.  5. No liver metastasis.    Impression and Plan:   58 year old woman with the following issues:   1. 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 She is receiving palliative chemotherapy in the form of Gemzar and Abraxane. She tolerated chemotherapy so far without any complications. CT scan from 01/19/2014 was discussed and showed a dramatic response to systemic chemotherapy. Her pancreatic lesion as well as peritoneal disease have decreased dramatically. Plan is to continue for at least 6 cycles of chemotherapy.   She is ready to proceed with day 1 of cycle #5.   2. Abdominal pain: She is currently utilizing oxycodone with good relief. Pain has improved after the start of chemotherapy.  3. IV access: She has a Port-A-Cath in place and inserted without any complications.  4. Nausea prophylaxis: She has antiemetics prescribed but has not needed to utilize them.  5.Thrombosis: Acute onset left lower extremity pain and swelling. Ultrasound Doppler on 12/12/2013 confirmed the presence of deep vein thrombosis. She is currently on Lovenox without any complications and continuous improvement in her left extremity  swelling. She is able to obtain Lovenox without any financial burden at this time.  6. Thrombocytopenia: resolved. Will continue with treatment if her platelet count is above 90,000.  7. Hyperglycemia: Patient encouraged to adhere to her medications as prescribed and to continue checking your blood sugars frequently.   8. Followup: On 03/06/2013 for day 1 of cycle 6. Labs have been ordered for her day 8 cycle #5 and day 8 cycle #6.   Patient reviewed with Dr. Sinda Du, PA-C  5/12/201511:45 AM

## 2014-02-20 ENCOUNTER — Ambulatory Visit (HOSPITAL_BASED_OUTPATIENT_CLINIC_OR_DEPARTMENT_OTHER): Payer: PRIVATE HEALTH INSURANCE

## 2014-02-20 ENCOUNTER — Other Ambulatory Visit (HOSPITAL_BASED_OUTPATIENT_CLINIC_OR_DEPARTMENT_OTHER): Payer: PRIVATE HEALTH INSURANCE

## 2014-02-20 VITALS — BP 138/87 | HR 107 | Temp 98.6°F

## 2014-02-20 DIAGNOSIS — C252 Malignant neoplasm of tail of pancreas: Secondary | ICD-10-CM

## 2014-02-20 DIAGNOSIS — C259 Malignant neoplasm of pancreas, unspecified: Secondary | ICD-10-CM

## 2014-02-20 DIAGNOSIS — E860 Dehydration: Secondary | ICD-10-CM

## 2014-02-20 LAB — COMPREHENSIVE METABOLIC PANEL (CC13)
ALBUMIN: 3 g/dL — AB (ref 3.5–5.0)
ALT: 50 U/L (ref 0–55)
ANION GAP: 13 meq/L — AB (ref 3–11)
AST: 43 U/L — ABNORMAL HIGH (ref 5–34)
Alkaline Phosphatase: 104 U/L (ref 40–150)
BUN: 4.4 mg/dL — ABNORMAL LOW (ref 7.0–26.0)
CALCIUM: 9.4 mg/dL (ref 8.4–10.4)
CHLORIDE: 102 meq/L (ref 98–109)
CO2: 23 meq/L (ref 22–29)
Creatinine: 0.7 mg/dL (ref 0.6–1.1)
GLUCOSE: 343 mg/dL — AB (ref 70–140)
Potassium: 4.5 mEq/L (ref 3.5–5.1)
SODIUM: 137 meq/L (ref 136–145)
Total Bilirubin: 0.35 mg/dL (ref 0.20–1.20)
Total Protein: 6.1 g/dL — ABNORMAL LOW (ref 6.4–8.3)

## 2014-02-20 LAB — CBC WITH DIFFERENTIAL/PLATELET
BASO%: 1.1 % (ref 0.0–2.0)
BASOS ABS: 0 10*3/uL (ref 0.0–0.1)
EOS ABS: 0 10*3/uL (ref 0.0–0.5)
EOS%: 0.3 % (ref 0.0–7.0)
HCT: 33.2 % — ABNORMAL LOW (ref 34.8–46.6)
HEMOGLOBIN: 10.8 g/dL — AB (ref 11.6–15.9)
LYMPH%: 34.1 % (ref 14.0–49.7)
MCH: 30.7 pg (ref 25.1–34.0)
MCHC: 32.5 g/dL (ref 31.5–36.0)
MCV: 94.3 fL (ref 79.5–101.0)
MONO#: 0.6 10*3/uL (ref 0.1–0.9)
MONO%: 15.9 % — ABNORMAL HIGH (ref 0.0–14.0)
NEUT%: 48.6 % (ref 38.4–76.8)
NEUTROS ABS: 1.8 10*3/uL (ref 1.5–6.5)
Platelets: 195 10*3/uL (ref 145–400)
RBC: 3.52 10*6/uL — AB (ref 3.70–5.45)
RDW: 18 % — AB (ref 11.2–14.5)
WBC: 3.8 10*3/uL — AB (ref 3.9–10.3)
lymph#: 1.3 10*3/uL (ref 0.9–3.3)
nRBC: 2 % — ABNORMAL HIGH (ref 0–0)

## 2014-02-20 MED ORDER — SODIUM CHLORIDE 0.9 % IV SOLN
Freq: Once | INTRAVENOUS | Status: AC
  Administered 2014-02-20: 11:00:00 via INTRAVENOUS

## 2014-02-20 NOTE — Patient Instructions (Signed)
Dehydration, Adult Dehydration is when you lose more fluids from the body than you take in. Vital organs like the kidneys, brain, and heart cannot function without a proper amount of fluids and salt. Any loss of fluids from the body can cause dehydration.  CAUSES   Vomiting.  Diarrhea.  Excessive sweating.  Excessive urine output.  Fever. SYMPTOMS  Mild dehydration  Thirst.  Dry lips.  Slightly dry mouth. Moderate dehydration  Very dry mouth.  Sunken eyes.  Skin does not bounce back quickly when lightly pinched and released.  Dark urine and decreased urine production.  Decreased tear production.  Headache. Severe dehydration  Very dry mouth.  Extreme thirst.  Rapid, weak pulse (more than 100 beats per minute at rest).  Cold hands and feet.  Not able to sweat in spite of heat and temperature.  Rapid breathing.  Blue lips.  Confusion and lethargy.  Difficulty being awakened.  Minimal urine production.  No tears. DIAGNOSIS  Your caregiver will diagnose dehydration based on your symptoms and your exam. Blood and urine tests will help confirm the diagnosis. The diagnostic evaluation should also identify the cause of dehydration. TREATMENT  Treatment of mild or moderate dehydration can often be done at home by increasing the amount of fluids that you drink. It is best to drink small amounts of fluid more often. Drinking too much at one time can make vomiting worse. Refer to the home care instructions below. Severe dehydration needs to be treated at the hospital where you will probably be given intravenous (IV) fluids that contain water and electrolytes. HOME CARE INSTRUCTIONS   Ask your caregiver about specific rehydration instructions.  Drink enough fluids to keep your urine clear or pale yellow.  Drink small amounts frequently if you have nausea and vomiting.  Eat as you normally do.  Avoid:  Foods or drinks high in sugar.  Carbonated  drinks.  Juice.  Extremely hot or cold fluids.  Drinks with caffeine.  Fatty, greasy foods.  Alcohol.  Tobacco.  Overeating.  Gelatin desserts.  Wash your hands well to avoid spreading bacteria and viruses.  Only take over-the-counter or prescription medicines for pain, discomfort, or fever as directed by your caregiver.  Ask your caregiver if you should continue all prescribed and over-the-counter medicines.  Keep all follow-up appointments with your caregiver. SEEK MEDICAL CARE IF:  You have abdominal pain and it increases or stays in one area (localizes).  You have a rash, stiff neck, or severe headache.  You are irritable, sleepy, or difficult to awaken.  You are weak, dizzy, or extremely thirsty. SEEK IMMEDIATE MEDICAL CARE IF:   You are unable to keep fluids down or you get worse despite treatment.  You have frequent episodes of vomiting or diarrhea.  You have blood or green matter (bile) in your vomit.  You have blood in your stool or your stool looks black and tarry.  You have not urinated in 6 to 8 hours, or you have only urinated a small amount of very dark urine.  You have a fever.  You faint. MAKE SURE YOU:   Understand these instructions.  Will watch your condition.  Will get help right away if you are not doing well or get worse. Document Released: 09/21/2005 Document Revised: 12/14/2011 Document Reviewed: 05/11/2011 [employer] Patient Information 2014 [employer].  

## 2014-02-20 NOTE — Progress Notes (Signed)
Pt c/o extreme weakness, increasing over the past two days. Difficulty getting around and caring for self at home. Not eating and drinking. Dr. Alen Blew examined pt in infusion room and ordered to hold chemo today, give IV fluids today. Return for MD and chemo appts as scheduled.

## 2014-03-01 ENCOUNTER — Telehealth: Payer: Self-pay | Admitting: Medical Oncology

## 2014-03-01 NOTE — Telephone Encounter (Signed)
Patient requesting for April and May CMP lab results to be faxed to Dr. Justin Mend @ [phone]. Patient informed fax sent as she requested.

## 2014-03-05 ENCOUNTER — Encounter: Payer: Self-pay | Admitting: Oncology

## 2014-03-05 ENCOUNTER — Telehealth: Payer: Self-pay | Admitting: Medical Oncology

## 2014-03-05 ENCOUNTER — Other Ambulatory Visit: Payer: Self-pay | Admitting: Medical Oncology

## 2014-03-05 DIAGNOSIS — C259 Malignant neoplasm of pancreas, unspecified: Secondary | ICD-10-CM

## 2014-03-05 NOTE — Telephone Encounter (Signed)
Patient called stating "developed strong urine odor" in tomorrow for labs/MD/tx.   Reviewed with Awilda Metro, APP. Per APP, orders place for UA and C&S. Patient informed

## 2014-03-05 NOTE — Progress Notes (Unsigned)
Insurance is paying gemzar and abraxane 100%

## 2014-03-06 ENCOUNTER — Encounter: Payer: Self-pay | Admitting: *Deleted

## 2014-03-06 ENCOUNTER — Other Ambulatory Visit (HOSPITAL_BASED_OUTPATIENT_CLINIC_OR_DEPARTMENT_OTHER): Payer: PRIVATE HEALTH INSURANCE

## 2014-03-06 ENCOUNTER — Ambulatory Visit (HOSPITAL_BASED_OUTPATIENT_CLINIC_OR_DEPARTMENT_OTHER): Payer: PRIVATE HEALTH INSURANCE | Admitting: Oncology

## 2014-03-06 ENCOUNTER — Encounter: Payer: Self-pay | Admitting: Medical Oncology

## 2014-03-06 ENCOUNTER — Telehealth: Payer: Self-pay | Admitting: Oncology

## 2014-03-06 ENCOUNTER — Ambulatory Visit (HOSPITAL_BASED_OUTPATIENT_CLINIC_OR_DEPARTMENT_OTHER): Payer: PRIVATE HEALTH INSURANCE

## 2014-03-06 VITALS — BP 146/86 | HR 97 | Temp 97.3°F | Resp 18 | Ht 65.0 in | Wt 145.5 lb

## 2014-03-06 DIAGNOSIS — C259 Malignant neoplasm of pancreas, unspecified: Secondary | ICD-10-CM

## 2014-03-06 DIAGNOSIS — Z7901 Long term (current) use of anticoagulants: Secondary | ICD-10-CM

## 2014-03-06 DIAGNOSIS — C787 Secondary malignant neoplasm of liver and intrahepatic bile duct: Secondary | ICD-10-CM

## 2014-03-06 DIAGNOSIS — C252 Malignant neoplasm of tail of pancreas: Secondary | ICD-10-CM

## 2014-03-06 DIAGNOSIS — C786 Secondary malignant neoplasm of retroperitoneum and peritoneum: Secondary | ICD-10-CM

## 2014-03-06 DIAGNOSIS — Z5111 Encounter for antineoplastic chemotherapy: Secondary | ICD-10-CM

## 2014-03-06 DIAGNOSIS — R7309 Other abnormal glucose: Secondary | ICD-10-CM

## 2014-03-06 DIAGNOSIS — I82409 Acute embolism and thrombosis of unspecified deep veins of unspecified lower extremity: Secondary | ICD-10-CM

## 2014-03-06 LAB — URINALYSIS, MICROSCOPIC - CHCC
Bilirubin (Urine): NEGATIVE
Blood: NEGATIVE
Glucose: NEGATIVE mg/dL
Ketones: NEGATIVE mg/dL
Nitrite: POSITIVE
PROTEIN: 30 mg/dL
RBC / HPF: NEGATIVE (ref 0–2)
SPECIFIC GRAVITY, URINE: 1.01 (ref 1.003–1.035)
Urobilinogen, UR: 0.2 mg/dL (ref 0.2–1)
pH: 6.5 (ref 4.6–8.0)

## 2014-03-06 LAB — CBC WITH DIFFERENTIAL/PLATELET
BASO%: 0.8 % (ref 0.0–2.0)
Basophils Absolute: 0 10*3/uL (ref 0.0–0.1)
EOS%: 1.9 % (ref 0.0–7.0)
Eosinophils Absolute: 0.1 10*3/uL (ref 0.0–0.5)
HEMATOCRIT: 39.5 % (ref 34.8–46.6)
HGB: 12.9 g/dL (ref 11.6–15.9)
LYMPH%: 39 % (ref 14.0–49.7)
MCH: 31.2 pg (ref 25.1–34.0)
MCHC: 32.7 g/dL (ref 31.5–36.0)
MCV: 95.3 fL (ref 79.5–101.0)
MONO#: 0.7 10*3/uL (ref 0.1–0.9)
MONO%: 14 % (ref 0.0–14.0)
NEUT#: 2.1 10*3/uL (ref 1.5–6.5)
NEUT%: 44.3 % (ref 38.4–76.8)
Platelets: 190 10*3/uL (ref 145–400)
RBC: 4.14 10*6/uL (ref 3.70–5.45)
RDW: 18 % — ABNORMAL HIGH (ref 11.2–14.5)
WBC: 4.8 10*3/uL (ref 3.9–10.3)
lymph#: 1.9 10*3/uL (ref 0.9–3.3)

## 2014-03-06 LAB — COMPREHENSIVE METABOLIC PANEL (CC13)
ALK PHOS: 108 U/L (ref 40–150)
ALT: 42 U/L (ref 0–55)
AST: 47 U/L — AB (ref 5–34)
Albumin: 3.5 g/dL (ref 3.5–5.0)
Anion Gap: 16 mEq/L — ABNORMAL HIGH (ref 3–11)
BUN: 6.6 mg/dL — AB (ref 7.0–26.0)
CALCIUM: 9.2 mg/dL (ref 8.4–10.4)
CHLORIDE: 103 meq/L (ref 98–109)
CO2: 20 mEq/L — ABNORMAL LOW (ref 22–29)
Creatinine: 0.7 mg/dL (ref 0.6–1.1)
Glucose: 159 mg/dl — ABNORMAL HIGH (ref 70–140)
Potassium: 3.7 mEq/L (ref 3.5–5.1)
Sodium: 139 mEq/L (ref 136–145)
Total Bilirubin: 0.46 mg/dL (ref 0.20–1.20)
Total Protein: 6.6 g/dL (ref 6.4–8.3)

## 2014-03-06 MED ORDER — ONDANSETRON 8 MG/NS 50 ML IVPB
INTRAVENOUS | Status: AC
  Filled 2014-03-06: qty 8

## 2014-03-06 MED ORDER — GEMCITABINE HCL CHEMO INJECTION 1 GM/26.3ML
1000.0000 mg/m2 | Freq: Once | INTRAVENOUS | Status: AC
  Administered 2014-03-06: 1862 mg via INTRAVENOUS
  Filled 2014-03-06: qty 48.97

## 2014-03-06 MED ORDER — SODIUM CHLORIDE 0.9 % IJ SOLN
10.0000 mL | INTRAMUSCULAR | Status: DC | PRN
  Administered 2014-03-06: 10 mL
  Filled 2014-03-06: qty 10

## 2014-03-06 MED ORDER — ONDANSETRON 8 MG/50ML IVPB (CHCC)
8.0000 mg | Freq: Once | INTRAVENOUS | Status: AC
  Administered 2014-03-06: 8 mg via INTRAVENOUS

## 2014-03-06 MED ORDER — SODIUM CHLORIDE 0.9 % IV SOLN
Freq: Once | INTRAVENOUS | Status: AC
  Administered 2014-03-06: 11:00:00 via INTRAVENOUS

## 2014-03-06 MED ORDER — HEPARIN SOD (PORK) LOCK FLUSH 100 UNIT/ML IV SOLN
500.0000 [IU] | Freq: Once | INTRAVENOUS | Status: AC | PRN
  Administered 2014-03-06: 500 [IU]
  Filled 2014-03-06: qty 5

## 2014-03-06 MED ORDER — PACLITAXEL PROTEIN-BOUND CHEMO INJECTION 100 MG
125.0000 mg/m2 | Freq: Once | INTRAVENOUS | Status: AC
  Administered 2014-03-06: 225 mg via INTRAVENOUS
  Filled 2014-03-06: qty 45

## 2014-03-06 MED ORDER — DEXAMETHASONE SODIUM PHOSPHATE 10 MG/ML IJ SOLN
INTRAMUSCULAR | Status: AC
  Filled 2014-03-06: qty 1

## 2014-03-06 MED ORDER — DEXAMETHASONE SODIUM PHOSPHATE 10 MG/ML IJ SOLN
10.0000 mg | Freq: Once | INTRAMUSCULAR | Status: AC
  Administered 2014-03-06: 10 mg via INTRAVENOUS

## 2014-03-06 NOTE — Telephone Encounter (Signed)
gva dn pritned appt sched and avs for pt for June...sed added tx. °

## 2014-03-06 NOTE — Progress Notes (Signed)
Hematology and Oncology Follow Up Visit  Christine Gould [id number] Feb 14, 1956 58 y.o. 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 This was diagnosed in February of 2015. Her CA 19-9 was 37,431.  Prior Therapy: She is status post endoscopic ultrasound and a biopsy completed on 11/07/2013 confirmed the presence of adenocarcinoma.  Current therapy: She is receiving palliative chemotherapy in the form of Gemzar and Abraxane given on day 1, day 8 of 21 day cycle. Status post 5 cycles Today is day 1 of cycle 6 of therapy.  Secondary diagnosis: She developed left deep vein thrombosis on 12/12/2013 and has been on Lovenox since.  Interim History:  Christine Gould presents today for a followup visit. She is accompanied by her daughter today prior to the administration of day 1 of cycle 6 of chemotherapy. Day 8 of the previous chemotherapy cycle was withheld due to failure to thrive and dehydration. Since she had 2 weeks off, she has felt a lot better and was able to hydrate much better. She does have some occasional nausea but does not require her to take her antiemetics medication.  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 This has not interfered with her ability to function. She is not dropping any items, is still able to button her close right her name legibly. She continues to have some swelling in her left leg however this is diminished and she is no longer experiencing pain.  She has tolerated on Lovenox without any complications. She has no problem giving herself the injections and have not reported any pain or injection-related problems. She denies any vomiting.  Denies hematochezia or melena.She denied any decline in  her performance status. She is reporting a decrease in her abdominal distention and less abdominal pain since the start of chemotherapy.She does have some occasional  abdominal pain/epigastric pain that is well-controlled with her oxycodone tablets.  She denied any alteration of mental status with confusion. She had not reported any syncope or any other complications. She has not reported any dizziness or seizure activity. Did not report any lymphadenopathy or petechiae. Did not report any skin rashes or lesions. Rest of her review of systems unremarkable.  Medications: I have reviewed the patient's current medications.  Current Outpatient Prescriptions  Medication Sig Dispense Refill  . acetaminophen (TYLENOL) 500 MG tablet Take 1,000 mg by mouth every 6 (six) hours as needed for mild pain or moderate pain.      Marland Kitchen ALPRAZolam (XANAX) 0.5 MG tablet       . amLODipine (NORVASC) 10 MG tablet Take 10 mg by mouth every morning.      . citalopram (CELEXA) 20 MG tablet Take 20 mg by mouth every morning.      . enoxaparin (LOVENOX) 100 MG/ML injection Inject 1.1 mLs (110 mg total) into the skin daily.  90 Syringe  1  . glyBURIDE (DIABETA) 5 MG tablet Take 5 mg by mouth daily with breakfast.      . ibuprofen (ADVIL,MOTRIN) 200 MG tablet Take 400 mg by mouth every 6 (six) hours as needed for mild pain or moderate pain.      Marland Kitchen lidocaine-prilocaine (EMLA) cream Apply 1 application topically daily as needed (port).      . metFORMIN (GLUCOPHAGE) 1000 MG tablet  Take 1,000 mg by mouth 2 (two) times daily with a meal.      . olmesartan-hydrochlorothiazide (BENICAR HCT) 40-25 MG per tablet Take 1 tablet by mouth every morning.      Marland Kitchen oxyCODONE (OXY IR/ROXICODONE) 5 MG immediate release tablet Take 1 tablet (5 mg total) by mouth every 4 (four) hours as needed for severe pain.  30 tablet  0  . prochlorperazine (COMPAZINE) 10 MG tablet Take 10 mg by mouth every 6 (six) hours as needed for nausea or vomiting.      .  traZODone (DESYREL) 100 MG tablet Take 100 mg by mouth at bedtime.       No current facility-administered medications for this visit.     Allergies:  Allergies  Allergen Reactions  . Statins Other (See Comments)    Cramps-fatigue  . Toprol Xl [Metoprolol] Other (See Comments)    Pain     Past Medical History, Surgical history, Social history, and Family History were reviewed and updated.   Physical Exam: Blood pressure 146/86, pulse 97, temperature 97.3 F (36.3 C), temperature source Oral, resp. rate 18, height 5\' 5"  (1.651 m), weight 145 lb 8 oz (65.998 kg), SpO2 100.00%.  ECOG: 1 General appearance: alert and cooperative Head: Normocephalic, without obvious abnormality Neck: no adenopathy and thyroid not enlarged, symmetric, no tenderness/mass/nodules Lymph nodes: Cervical, supraclavicular, and axillary nodes normal. Heart:regular rate and rhythm, S1, S2. Lung:chest clear, no wheezing, no dullness to percussion to Abdomin: soft, non-tender, without masses or organomegaly.No appreciable  ascites or shifting dullness. EXT: 1+ edema on the left, trace on the right  Neurological examination: No deficits noted on today's exam  Lab Results: Lab Results  Component Value Date   WBC 4.8 03/06/2014   HGB 12.9 03/06/2014   HCT 39.5 03/06/2014   MCV 95.3 03/06/2014   PLT 190 03/06/2014     Chemistry      Component Value Date/Time   NA 137 02/20/2014 0953   K 4.5 02/20/2014 0953   CO2 23 02/20/2014 0953   BUN 4.4* 02/20/2014 0953   CREATININE 0.7 02/20/2014 0953      Component Value Date/Time   CALCIUM 9.4 02/20/2014 0953   ALKPHOS 104 02/20/2014 0953   AST 43* 02/20/2014 0953   ALT 50 02/20/2014 0953   BILITOT 0.35 02/20/2014 0953      Impression and Plan:   57 year old woman with the following issues:   1. 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 She is receiving  palliative chemotherapy in the form of Gemzar and Abraxane. She tolerated chemotherapy so far without any complications. CT scan from 01/19/2014  showed a dramatic response to systemic chemotherapy. Her pancreatic lesion as well as peritoneal disease have decreased dramatically. Plan is to continue with chemotherapy for about 8 cycles and repeat imaging studies at this time. We'll proceed today with chemotherapy as scheduled.  2. Abdominal/epigastric pain: She is currently utilizing oxycodone with good relief. Pain has improved after the start of chemotherapy.  3. IV access: She has a Port-A-Cath in place and inserted without any complications.  4. Nausea prophylaxis: She has antiemetics prescribed but has not needed to utilize them.  5.Thrombosis: Of the left lower extremity. She is currently on Lovenox and plan to continue for the time being.  6. Thrombocytopenia: resolved. Will continue with treatment if her platelet count is above 90,000.  7. Hyperglycemia: Patient encouraged to adhere to her medications  as prescribed and to continue checking your blood sugars frequently. I will fax her lab results to her primary care physician for recommendations regarding adjusting her diabetes medications  8. Followup: On 03/13/2014 for day 8 of cycle 6.   Wyatt Portela, MD 6/2/201510:27 AM

## 2014-03-06 NOTE — Progress Notes (Signed)
RECEIVED A FAX FROM [facility] CONCERNING A PRIOR AUTHORIZATION FOR ENOXAPARIN. THIS REQUEST WAS PLACED IN THE MANAGED CARE BIN. 

## 2014-03-06 NOTE — Progress Notes (Signed)
Per patient request, today's labs faxed to Dr. Justin Mend @ [phone]

## 2014-03-06 NOTE — Patient Instructions (Signed)
[facility] Discharge Instructions for Patients Receiving Chemotherapy  Today you received the following chemotherapy agents Abraxane/Gemzar  To help prevent nausea and vomiting after your treatment, we encourage you to take your nausea medication as prescribed.   If you develop nausea and vomiting that is not controlled by your nausea medication, call the clinic.   BELOW ARE SYMPTOMS THAT SHOULD BE REPORTED IMMEDIATELY:  *FEVER GREATER THAN 100.5 F  *CHILLS WITH OR WITHOUT FEVER  NAUSEA AND VOMITING THAT IS NOT CONTROLLED WITH YOUR NAUSEA MEDICATION  *UNUSUAL SHORTNESS OF BREATH  *UNUSUAL BRUISING OR BLEEDING  TENDERNESS IN MOUTH AND THROAT WITH OR WITHOUT PRESENCE OF ULCERS  *URINARY PROBLEMS  *BOWEL PROBLEMS  UNUSUAL RASH Items with * indicate a potential emergency and should be followed up as soon as possible.  Feel free to call the clinic you have any questions or concerns. The clinic phone number is [phone].    

## 2014-03-07 ENCOUNTER — Encounter: Payer: Self-pay | Admitting: Oncology

## 2014-03-07 NOTE — Progress Notes (Signed)
Faxed lovenox pa form to [employer]

## 2014-03-07 NOTE — Progress Notes (Signed)
Received fax from [facility] stating Your request for prior authorization has been cancelled.  This was placed in the Managed Care bin.

## 2014-03-08 ENCOUNTER — Telehealth: Payer: Self-pay | Admitting: Medical Oncology

## 2014-03-08 ENCOUNTER — Other Ambulatory Visit: Payer: Self-pay | Admitting: Medical Oncology

## 2014-03-08 LAB — URINE CULTURE

## 2014-03-08 MED ORDER — CIPROFLOXACIN HCL 500 MG PO TABS
500.0000 mg | ORAL_TABLET | Freq: Two times a day (BID) | ORAL | Status: DC
Start: 2014-03-08 — End: 2014-04-08

## 2014-03-08 NOTE — Telephone Encounter (Signed)
Looks like a UTI. Culture is pending but we can call her Cipro if she is not allergic.   500 mg bid for 7 days. # 14.   Thanks.

## 2014-03-08 NOTE — Telephone Encounter (Signed)
Return call to patient regarding UA, informed patient per MD results look like a UTI. Per MD, patient to take Cipro 500 mg twice a day for 7 days. Prescription sent to her listed pharmacy. Patient confirms no allergy to Cipro. Patient informed that Culture & Sensitivity result still pending and can take up to 5 days to result. Patient gave verbal understanding on medication directions and encouraged to call office with questions and/or concerns.

## 2014-03-08 NOTE — Telephone Encounter (Signed)
Patient requesting to know results of UA done 06/02.   MD inboxed.

## 2014-03-12 ENCOUNTER — Encounter: Payer: Self-pay | Admitting: Oncology

## 2014-03-12 NOTE — Progress Notes (Signed)
[place], [phone], approved lovenox from 03/07/14-03/08/15

## 2014-03-13 ENCOUNTER — Ambulatory Visit (HOSPITAL_BASED_OUTPATIENT_CLINIC_OR_DEPARTMENT_OTHER): Payer: PRIVATE HEALTH INSURANCE

## 2014-03-13 ENCOUNTER — Ambulatory Visit (HOSPITAL_BASED_OUTPATIENT_CLINIC_OR_DEPARTMENT_OTHER): Payer: PRIVATE HEALTH INSURANCE | Admitting: Oncology

## 2014-03-13 ENCOUNTER — Encounter: Payer: Self-pay | Admitting: Oncology

## 2014-03-13 ENCOUNTER — Other Ambulatory Visit: Payer: PRIVATE HEALTH INSURANCE

## 2014-03-13 ENCOUNTER — Other Ambulatory Visit (HOSPITAL_BASED_OUTPATIENT_CLINIC_OR_DEPARTMENT_OTHER): Payer: PRIVATE HEALTH INSURANCE

## 2014-03-13 VITALS — BP 103/70 | HR 63 | Temp 97.9°F | Resp 18 | Ht 65.0 in | Wt 147.3 lb

## 2014-03-13 DIAGNOSIS — C252 Malignant neoplasm of tail of pancreas: Secondary | ICD-10-CM

## 2014-03-13 DIAGNOSIS — C259 Malignant neoplasm of pancreas, unspecified: Secondary | ICD-10-CM

## 2014-03-13 DIAGNOSIS — C787 Secondary malignant neoplasm of liver and intrahepatic bile duct: Secondary | ICD-10-CM

## 2014-03-13 DIAGNOSIS — C786 Secondary malignant neoplasm of retroperitoneum and peritoneum: Secondary | ICD-10-CM

## 2014-03-13 DIAGNOSIS — G609 Hereditary and idiopathic neuropathy, unspecified: Secondary | ICD-10-CM

## 2014-03-13 DIAGNOSIS — Z5111 Encounter for antineoplastic chemotherapy: Secondary | ICD-10-CM

## 2014-03-13 DIAGNOSIS — I82409 Acute embolism and thrombosis of unspecified deep veins of unspecified lower extremity: Secondary | ICD-10-CM

## 2014-03-13 DIAGNOSIS — R109 Unspecified abdominal pain: Secondary | ICD-10-CM

## 2014-03-13 LAB — CBC WITH DIFFERENTIAL/PLATELET
BASO%: 0.4 % (ref 0.0–2.0)
Basophils Absolute: 0 10*3/uL (ref 0.0–0.1)
EOS%: 0.6 % (ref 0.0–7.0)
Eosinophils Absolute: 0 10*3/uL (ref 0.0–0.5)
HEMATOCRIT: 35.8 % (ref 34.8–46.6)
HGB: 11.6 g/dL (ref 11.6–15.9)
LYMPH#: 1.7 10*3/uL (ref 0.9–3.3)
LYMPH%: 35.6 % (ref 14.0–49.7)
MCH: 30.6 pg (ref 25.1–34.0)
MCHC: 32.4 g/dL (ref 31.5–36.0)
MCV: 94.2 fL (ref 79.5–101.0)
MONO#: 0.6 10*3/uL (ref 0.1–0.9)
MONO%: 12 % (ref 0.0–14.0)
NEUT#: 2.4 10*3/uL (ref 1.5–6.5)
NEUT%: 51.4 % (ref 38.4–76.8)
Platelets: 135 10*3/uL — ABNORMAL LOW (ref 145–400)
RBC: 3.8 10*6/uL (ref 3.70–5.45)
RDW: 17 % — ABNORMAL HIGH (ref 11.2–14.5)
WBC: 4.7 10*3/uL (ref 3.9–10.3)

## 2014-03-13 LAB — COMPREHENSIVE METABOLIC PANEL (CC13)
ALT: 46 U/L (ref 0–55)
AST: 38 U/L — ABNORMAL HIGH (ref 5–34)
Albumin: 3.1 g/dL — ABNORMAL LOW (ref 3.5–5.0)
Alkaline Phosphatase: 96 U/L (ref 40–150)
Anion Gap: 12 mEq/L — ABNORMAL HIGH (ref 3–11)
BUN: 7.7 mg/dL (ref 7.0–26.0)
CALCIUM: 9.2 mg/dL (ref 8.4–10.4)
CHLORIDE: 104 meq/L (ref 98–109)
CO2: 24 meq/L (ref 22–29)
Creatinine: 0.7 mg/dL (ref 0.6–1.1)
Glucose: 259 mg/dl — ABNORMAL HIGH (ref 70–140)
POTASSIUM: 3.4 meq/L — AB (ref 3.5–5.1)
SODIUM: 140 meq/L (ref 136–145)
TOTAL PROTEIN: 6.2 g/dL — AB (ref 6.4–8.3)
Total Bilirubin: 0.31 mg/dL (ref 0.20–1.20)

## 2014-03-13 MED ORDER — DEXAMETHASONE SODIUM PHOSPHATE 10 MG/ML IJ SOLN
10.0000 mg | Freq: Once | INTRAMUSCULAR | Status: AC
  Administered 2014-03-13: 10 mg via INTRAVENOUS

## 2014-03-13 MED ORDER — DEXAMETHASONE SODIUM PHOSPHATE 10 MG/ML IJ SOLN
INTRAMUSCULAR | Status: AC
  Filled 2014-03-13: qty 1

## 2014-03-13 MED ORDER — SODIUM CHLORIDE 0.9 % IV SOLN
Freq: Once | INTRAVENOUS | Status: AC
  Administered 2014-03-13: 10:00:00 via INTRAVENOUS

## 2014-03-13 MED ORDER — ONDANSETRON 8 MG/50ML IVPB (CHCC)
8.0000 mg | Freq: Once | INTRAVENOUS | Status: AC
  Administered 2014-03-13: 8 mg via INTRAVENOUS

## 2014-03-13 MED ORDER — PACLITAXEL PROTEIN-BOUND CHEMO INJECTION 100 MG
125.0000 mg/m2 | Freq: Once | INTRAVENOUS | Status: AC
  Administered 2014-03-13: 225 mg via INTRAVENOUS
  Filled 2014-03-13: qty 45

## 2014-03-13 MED ORDER — ONDANSETRON 8 MG/NS 50 ML IVPB
INTRAVENOUS | Status: AC
  Filled 2014-03-13: qty 8

## 2014-03-13 MED ORDER — SODIUM CHLORIDE 0.9 % IV SOLN
1000.0000 mg/m2 | Freq: Once | INTRAVENOUS | Status: AC
  Administered 2014-03-13: 1862 mg via INTRAVENOUS
  Filled 2014-03-13: qty 48.97

## 2014-03-13 MED ORDER — HEPARIN SOD (PORK) LOCK FLUSH 100 UNIT/ML IV SOLN
500.0000 [IU] | Freq: Once | INTRAVENOUS | Status: AC | PRN
  Administered 2014-03-13: 500 [IU]
  Filled 2014-03-13: qty 5

## 2014-03-13 MED ORDER — SODIUM CHLORIDE 0.9 % IJ SOLN
10.0000 mL | INTRAMUSCULAR | Status: DC | PRN
  Administered 2014-03-13: 10 mL
  Filled 2014-03-13: qty 10

## 2014-03-13 NOTE — Patient Instructions (Signed)
[facility] Discharge Instructions for Patients Receiving Chemotherapy  Today you received the following chemotherapy agents:  Abraxane and Gemzar  To help prevent nausea and vomiting after your treatment, we encourage you to take your nausea medication as ordered per MD.   If you develop nausea and vomiting that is not controlled by your nausea medication, call the clinic.   BELOW ARE SYMPTOMS THAT SHOULD BE REPORTED IMMEDIATELY:  *FEVER GREATER THAN 100.5 F  *CHILLS WITH OR WITHOUT FEVER  NAUSEA AND VOMITING THAT IS NOT CONTROLLED WITH YOUR NAUSEA MEDICATION  *UNUSUAL SHORTNESS OF BREATH  *UNUSUAL BRUISING OR BLEEDING  TENDERNESS IN MOUTH AND THROAT WITH OR WITHOUT PRESENCE OF ULCERS  *URINARY PROBLEMS  *BOWEL PROBLEMS  UNUSUAL RASH Items with * indicate a potential emergency and should be followed up as soon as possible.  Feel free to call the clinic you have any questions or concerns. The clinic phone number is [phone].

## 2014-03-13 NOTE — Progress Notes (Signed)
Hematology and Oncology Follow Up Visit  Christine Gould [id number] December 10, 1955 58 y.o. 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 This was diagnosed in February of 2015. Her CA 19-9 was 37,431.  Prior Therapy: She is status post endoscopic ultrasound and a biopsy completed on 11/07/2013 confirmed the presence of adenocarcinoma.  Current therapy: She is receiving palliative chemotherapy in the form of Gemzar and Abraxane given on day 1, day 8 of 21 day cycle. Status post 5 cycles Today is day 8 of cycle 6 of therapy.  Secondary diagnosis: She developed left deep vein thrombosis on 12/12/2013 and has been on Lovenox since.  Interim History:  Christine Gould presents today for a followup visit. She is accompanied by her husband today. Since the last visit she has done well and tolerated chemotherapy well. She has reported slight increase in her left lower extremity edema but no pain or tenderness. She is also completing antibiotic course for urinary tract infection. She does have some occasional nausea but does not require her to take her antiemetics medication.  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 This has not interfered with her ability to perform daily functions.  She has tolerated on Lovenox without any complications. She has no problem giving herself the injections and have not reported any pain or injection-related problems. She denies hematochezia or melena.She denied any decline in her performance status. She is reporting a decrease in her abdominal distention and less abdominal pain since the start of chemotherapy.She does have some occasional  abdominal pain/epigastric pain that is well-controlled with her  oxycodone tablets.  She denied any alteration of mental status with confusion. She had not reported any syncope or any other complications. She has not reported any dizziness or seizure activity. Did not report any lymphadenopathy or petechiae. Did not report any skin rashes or lesions. Her blood sugar have been under excellent control per her reports. Rest of her review of systems unremarkable.  Medications: I have reviewed the patient's current medications.  Current Outpatient Prescriptions  Medication Sig Dispense Refill  . acetaminophen (TYLENOL) 500 MG tablet Take 1,000 mg by mouth every 6 (six) hours as needed for mild pain or moderate pain.      Marland Kitchen ALPRAZolam (XANAX) 0.5 MG tablet       . amLODipine (NORVASC) 10 MG tablet Take 10 mg by mouth every morning.      . ciprofloxacin (CIPRO) 500 MG tablet Take 1 tablet (500 mg total) by mouth 2 (two) times daily. For 7 days.  14 tablet  0  . citalopram (CELEXA) 20 MG tablet Take 20 mg by mouth every morning.      . enoxaparin (LOVENOX) 100 MG/ML injection Inject 1.1 mLs (110 mg total) into the skin daily.  90 Syringe  1  . glyBURIDE (DIABETA) 5 MG tablet Take 5 mg by mouth daily with breakfast.      . ibuprofen (ADVIL,MOTRIN) 200 MG tablet Take 400 mg by mouth every 6 (six) hours as needed for mild pain or moderate pain.      Marland Kitchen lidocaine-prilocaine (EMLA) cream Apply 1 application topically daily as needed (port).      . metFORMIN (GLUCOPHAGE) 1000 MG tablet Take 1,000 mg by mouth 2 (two) times daily with a  meal.      . olmesartan-hydrochlorothiazide (BENICAR HCT) 40-25 MG per tablet Take 1 tablet by mouth every morning.      Marland Kitchen oxyCODONE (OXY IR/ROXICODONE) 5 MG immediate release tablet Take 1 tablet (5 mg total) by mouth every 4 (four) hours as needed for severe pain.  30 tablet  0  . prochlorperazine (COMPAZINE) 10 MG tablet Take 10 mg by mouth every 6 (six) hours as needed for nausea or vomiting.      . traZODone (DESYREL) 100 MG tablet Take 100  mg by mouth at bedtime.       No current facility-administered medications for this visit.     Allergies:  Allergies  Allergen Reactions  . Statins Other (See Comments)    Cramps-fatigue  . Toprol Xl [Metoprolol] Other (See Comments)    Pain     Past Medical History, Surgical history, Social history, and Family History were reviewed and updated.   Physical Exam: Blood pressure 103/70, pulse 63, temperature 97.9 F (36.6 C), temperature source Oral, resp. rate 18, height 5\' 5"  (1.651 m), weight 147 lb 4.8 oz (66.815 kg), SpO2 97.00%.  ECOG: 1 General appearance: alert and cooperative Head: Normocephalic, without obvious abnormality Neck: no adenopathy and thyroid not enlarged, symmetric, no tenderness/mass/nodules Lymph nodes: Cervical, supraclavicular, and axillary nodes normal. Heart:regular rate and rhythm, S1, S2. Lung:chest clear, no wheezing, no dullness to percussion to Abdomin: soft, non-tender, without masses or organomegaly.No ascites or shifting dullness. EXT: 1+ edema on the left below the knee, trace on the right  Neurological examination: No deficits noted on today's exam  Lab Results: Lab Results  Component Value Date   WBC 4.7 03/13/2014   HGB 11.6 03/13/2014   HCT 35.8 03/13/2014   MCV 94.2 03/13/2014   PLT 135* 03/13/2014     Chemistry      Component Value Date/Time   NA 139 03/06/2014 0958   K 3.7 03/06/2014 0958   CO2 20* 03/06/2014 0958   BUN 6.6* 03/06/2014 0958   CREATININE 0.7 03/06/2014 0958      Component Value Date/Time   CALCIUM 9.2 03/06/2014 0958   ALKPHOS 108 03/06/2014 0958   AST 47* 03/06/2014 0958   ALT 42 03/06/2014 0958   BILITOT 0.46 03/06/2014 0958        Impression and Plan:   58 year old woman with the following issues:   1. 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 She is receiving palliative chemotherapy in the form of Gemzar  and Abraxane. She tolerated chemotherapy so far without any complications. CT scan from 01/19/2014  showed a dramatic response to systemic chemotherapy. The plan is to continue with chemotherapy for about 8 cycles and repeat imaging studies at this time. We will proceed with day 8 of this current cycle of chemotherapy.  2. Abdominal/epigastric pain: She is currently utilizing oxycodone with good relief. Pain has improved after the start of chemotherapy.  3. IV access: She has a Port-A-Cath in place and inserted without any complications.  4. Nausea prophylaxis: She has antiemetics prescribed but has not needed to utilize them.  5.Thrombosis: Of the left lower extremity. She is currently on Lovenox and plan to continue for the time being. She continues to have left leg edema but I doubt Lovenox failure at this time.  6. Thrombocytopenia: resolved. Will continue with treatment if her platelet count is above 90,000.  7. Hyperglycemia: Blood sugar have been on  the better control lately  8. Followup: On 03/27/2014 for day 1 of cycle 7.  Wyatt Portela, MD 6/9/20158:59 AM

## 2014-03-13 NOTE — Progress Notes (Signed)
Faxed o.v. Notes, labs and latest scan result to dr [name] webb, per patient's request.

## 2014-03-27 ENCOUNTER — Ambulatory Visit (HOSPITAL_BASED_OUTPATIENT_CLINIC_OR_DEPARTMENT_OTHER): Payer: PRIVATE HEALTH INSURANCE

## 2014-03-27 ENCOUNTER — Other Ambulatory Visit (HOSPITAL_BASED_OUTPATIENT_CLINIC_OR_DEPARTMENT_OTHER): Payer: PRIVATE HEALTH INSURANCE

## 2014-03-27 ENCOUNTER — Encounter: Payer: Self-pay | Admitting: Oncology

## 2014-03-27 ENCOUNTER — Telehealth: Payer: Self-pay | Admitting: Oncology

## 2014-03-27 ENCOUNTER — Ambulatory Visit (HOSPITAL_BASED_OUTPATIENT_CLINIC_OR_DEPARTMENT_OTHER): Payer: PRIVATE HEALTH INSURANCE | Admitting: Oncology

## 2014-03-27 VITALS — BP 101/67 | HR 90 | Temp 97.1°F | Resp 18 | Ht 65.0 in | Wt 144.6 lb

## 2014-03-27 DIAGNOSIS — R109 Unspecified abdominal pain: Secondary | ICD-10-CM

## 2014-03-27 DIAGNOSIS — C259 Malignant neoplasm of pancreas, unspecified: Secondary | ICD-10-CM

## 2014-03-27 DIAGNOSIS — C787 Secondary malignant neoplasm of liver and intrahepatic bile duct: Secondary | ICD-10-CM

## 2014-03-27 DIAGNOSIS — Z5111 Encounter for antineoplastic chemotherapy: Secondary | ICD-10-CM

## 2014-03-27 DIAGNOSIS — C252 Malignant neoplasm of tail of pancreas: Secondary | ICD-10-CM

## 2014-03-27 DIAGNOSIS — C250 Malignant neoplasm of head of pancreas: Secondary | ICD-10-CM

## 2014-03-27 DIAGNOSIS — C786 Secondary malignant neoplasm of retroperitoneum and peritoneum: Secondary | ICD-10-CM

## 2014-03-27 DIAGNOSIS — I82819 Embolism and thrombosis of superficial veins of unspecified lower extremities: Secondary | ICD-10-CM

## 2014-03-27 LAB — CBC WITH DIFFERENTIAL/PLATELET
BASO%: 0.5 % (ref 0.0–2.0)
Basophils Absolute: 0 10*3/uL (ref 0.0–0.1)
EOS%: 3.3 % (ref 0.0–7.0)
Eosinophils Absolute: 0.1 10*3/uL (ref 0.0–0.5)
HCT: 38.3 % (ref 34.8–46.6)
HGB: 12.6 g/dL (ref 11.6–15.9)
LYMPH%: 43.7 % (ref 14.0–49.7)
MCH: 31 pg (ref 25.1–34.0)
MCHC: 32.9 g/dL (ref 31.5–36.0)
MCV: 94.2 fL (ref 79.5–101.0)
MONO#: 0.5 10*3/uL (ref 0.1–0.9)
MONO%: 18.1 % — AB (ref 0.0–14.0)
NEUT#: 1 10*3/uL — ABNORMAL LOW (ref 1.5–6.5)
NEUT%: 34.4 % — AB (ref 38.4–76.8)
PLATELETS: 255 10*3/uL (ref 145–400)
RBC: 4.06 10*6/uL (ref 3.70–5.45)
RDW: 18.3 % — ABNORMAL HIGH (ref 11.2–14.5)
WBC: 3 10*3/uL — ABNORMAL LOW (ref 3.9–10.3)
lymph#: 1.3 10*3/uL (ref 0.9–3.3)

## 2014-03-27 LAB — COMPREHENSIVE METABOLIC PANEL (CC13)
ALK PHOS: 101 U/L (ref 40–150)
ALT: 38 U/L (ref 0–55)
AST: 24 U/L (ref 5–34)
Albumin: 3.4 g/dL — ABNORMAL LOW (ref 3.5–5.0)
Anion Gap: 12 mEq/L — ABNORMAL HIGH (ref 3–11)
BILIRUBIN TOTAL: 0.43 mg/dL (ref 0.20–1.20)
BUN: 6.9 mg/dL — ABNORMAL LOW (ref 7.0–26.0)
CO2: 26 mEq/L (ref 22–29)
CREATININE: 0.8 mg/dL (ref 0.6–1.1)
Calcium: 9.6 mg/dL (ref 8.4–10.4)
Chloride: 101 mEq/L (ref 98–109)
Glucose: 174 mg/dl — ABNORMAL HIGH (ref 70–140)
Potassium: 3.8 mEq/L (ref 3.5–5.1)
SODIUM: 139 meq/L (ref 136–145)
TOTAL PROTEIN: 6.4 g/dL (ref 6.4–8.3)

## 2014-03-27 LAB — CANCER ANTIGEN 19-9: CA 19 9: 185.4 U/mL — AB (ref <35.0)

## 2014-03-27 MED ORDER — DEXAMETHASONE SODIUM PHOSPHATE 10 MG/ML IJ SOLN
INTRAMUSCULAR | Status: AC
  Filled 2014-03-27: qty 1

## 2014-03-27 MED ORDER — ONDANSETRON 8 MG/50ML IVPB (CHCC)
8.0000 mg | Freq: Once | INTRAVENOUS | Status: AC
  Administered 2014-03-27: 8 mg via INTRAVENOUS

## 2014-03-27 MED ORDER — PACLITAXEL PROTEIN-BOUND CHEMO INJECTION 100 MG
125.0000 mg/m2 | Freq: Once | INTRAVENOUS | Status: AC
  Administered 2014-03-27: 225 mg via INTRAVENOUS
  Filled 2014-03-27: qty 45

## 2014-03-27 MED ORDER — DEXAMETHASONE SODIUM PHOSPHATE 10 MG/ML IJ SOLN
10.0000 mg | Freq: Once | INTRAMUSCULAR | Status: AC
  Administered 2014-03-27: 10 mg via INTRAVENOUS

## 2014-03-27 MED ORDER — SODIUM CHLORIDE 0.9 % IV SOLN
1000.0000 mg/m2 | Freq: Once | INTRAVENOUS | Status: AC
  Administered 2014-03-27: 1862 mg via INTRAVENOUS
  Filled 2014-03-27: qty 48.97

## 2014-03-27 MED ORDER — SODIUM CHLORIDE 0.9 % IJ SOLN
10.0000 mL | INTRAMUSCULAR | Status: DC | PRN
  Administered 2014-03-27: 10 mL
  Filled 2014-03-27: qty 10

## 2014-03-27 MED ORDER — HEPARIN SOD (PORK) LOCK FLUSH 100 UNIT/ML IV SOLN
500.0000 [IU] | Freq: Once | INTRAVENOUS | Status: AC | PRN
  Administered 2014-03-27: 500 [IU]
  Filled 2014-03-27: qty 5

## 2014-03-27 MED ORDER — ONDANSETRON 8 MG/NS 50 ML IVPB
INTRAVENOUS | Status: AC
  Filled 2014-03-27: qty 8

## 2014-03-27 MED ORDER — SODIUM CHLORIDE 0.9 % IV SOLN
Freq: Once | INTRAVENOUS | Status: AC
  Administered 2014-03-27: 11:00:00 via INTRAVENOUS

## 2014-03-27 NOTE — Progress Notes (Signed)
Hematology and Oncology Follow Up Visit  Christine Gould [id number] 1955/12/29 58 y.o. 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 This was diagnosed in February of 2015. Her CA 19-9 was 37,431.  Prior Therapy: She is status post endoscopic ultrasound and a biopsy completed on 11/07/2013 confirmed the presence of adenocarcinoma.  Current therapy: She is receiving palliative chemotherapy in the form of Gemzar and Abraxane given on day 1, day 8 of 21 day cycle. Status post 6 cycles Today is day 1 of cycle 7 of therapy.  Secondary diagnosis: She developed left deep vein thrombosis on 12/12/2013 and has been on Lovenox since.  Interim History:  Christine Gould presents today for a followup visit with her daughter. Since the last visit she, she reported more difficulties with chemotherapy. She is increased more fatigue and tiredness. She has reported slight increase in her left lower extremity edema but no pain or tenderness. She does have some occasional nausea but does not require her to take her antiemetics medication no vomiting noted.  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 This has not interfered with her ability to perform daily functions including buttoning her shirt as well as a writing.  She has tolerated on Lovenox without any complications. She has no problem giving herself the injections and have not reported any pain or injection-related problems. She denies hematochezia or melena.She denied any decline in her performance status. She is reporting a continuous decrease in her abdominal distention and less abdominal pain since the start of chemotherapy.She does have some occasional  abdominal pain/epigastric  pain that is well-controlled with her oxycodone tablets.  She denied any alteration of mental status with confusion. She had not reported any syncope or any other complications. She has not reported any dizziness or seizure activity. Did not report any lymphadenopathy or petechiae. Did not report any skin rashes or lesions. Her blood sugar have been under better control. She has not reported any skeletal complaints or pains. Has not reported any mood disturbance. Rest of her review of systems unremarkable.  Medications: I have reviewed the patient's current medications.  Current Outpatient Prescriptions  Medication Sig Dispense Refill  . acetaminophen (TYLENOL) 500 MG tablet Take 1,000 mg by mouth every 6 (six) hours as needed for mild pain or moderate pain.      Marland Kitchen ALPRAZolam (XANAX) 0.5 MG tablet       . amLODipine (NORVASC) 10 MG tablet Take 10 mg by mouth every morning.      . ciprofloxacin (CIPRO) 500 MG tablet Take 1 tablet (500 mg total) by mouth 2 (two) times daily. For 7 days.  14 tablet  0  . citalopram (CELEXA) 20 MG tablet Take 20 mg by mouth every morning.      . enoxaparin (LOVENOX) 100 MG/ML injection Inject 1.1 mLs (110 mg total) into the skin daily.  90 Syringe  1  . glyBURIDE (DIABETA) 5 MG tablet Take 5 mg by mouth daily with breakfast.      . ibuprofen (ADVIL,MOTRIN) 200 MG tablet Take 400 mg by mouth every 6 (six) hours as needed for mild pain or moderate pain.      Marland Kitchen lidocaine-prilocaine (EMLA) cream Apply 1 application topically daily as needed (port).      Marland Kitchen  metFORMIN (GLUCOPHAGE) 1000 MG tablet Take 1,000 mg by mouth 2 (two) times daily with a meal.      . olmesartan-hydrochlorothiazide (BENICAR HCT) 40-25 MG per tablet Take 1 tablet by mouth every morning.      Marland Kitchen oxyCODONE (OXY IR/ROXICODONE) 5 MG immediate release tablet Take 1 tablet (5 mg total) by mouth every 4 (four) hours as needed for severe pain.  30 tablet  0  . prochlorperazine (COMPAZINE) 10 MG tablet Take 10 mg  by mouth every 6 (six) hours as needed for nausea or vomiting.      . traZODone (DESYREL) 100 MG tablet Take 100 mg by mouth at bedtime.       No current facility-administered medications for this visit.     Allergies:  Allergies  Allergen Reactions  . Statins Other (See Comments)    Cramps-fatigue  . Toprol Xl [Metoprolol] Other (See Comments)    Pain     Past Medical History, Surgical history, Social history, and Family History were reviewed and updated.   Physical Exam: Blood pressure 101/67, pulse 90, temperature 97.1 F (36.2 C), temperature source Oral, resp. rate 18, height 5\' 5"  (1.651 m), weight 144 lb 9.6 oz (65.59 kg).  ECOG: 1 General appearance: alert Head: Normocephalic, without obvious abnormality Neck: no adenopathy Lymph nodes: Cervical, supraclavicular, and axillary nodes normal. Heart:regular rate and rhythm, S1, S2. Lung:chest clear, no wheezing, no dullness to percussion to Abdomin: soft, non-tender, without masses or organomegaly.No ascites or shifting dullness. EXT: 1+ edema on the left below the knee, trace on the right  Neurological examination: No deficits noted on today's exam  Lab Results: Lab Results  Component Value Date   WBC 3.0* 03/27/2014   HGB 12.6 03/27/2014   HCT 38.3 03/27/2014   MCV 94.2 03/27/2014   PLT 255 03/27/2014     Chemistry      Component Value Date/Time   NA 139 03/27/2014 0930   K 3.8 03/27/2014 0930   CO2 26 03/27/2014 0930   BUN 6.9* 03/27/2014 0930   CREATININE 0.8 03/27/2014 0930      Component Value Date/Time   CALCIUM 9.6 03/27/2014 0930   ALKPHOS 101 03/27/2014 0930   AST 24 03/27/2014 0930   ALT 38 03/27/2014 0930   BILITOT 0.43 03/27/2014 0930        Impression and Plan:   58 year old woman with the following issues:   1. 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 She is receiving palliative  chemotherapy in the form of Gemzar and Abraxane. She tolerated chemotherapy so far without any complications. CT scan from 01/19/2014  showed a dramatic response to systemic chemotherapy. The plan is to complete cycle 7 of chemotherapy and obtain a CT scan. CT scan will be scheduled on her visit today. If she is not up to treatments for day 8 of this current cycle, we will heart chemotherapy and hydrate instead. Her absolute neutrophil count is borderline today but likely will be lower next week.  2. Abdominal/epigastric pain: She is currently utilizing oxycodone with good relief. Pain has improved after the start of chemotherapy.  3. IV access: She has a Port-A-Cath in place and inserted without any complications.  4. Nausea prophylaxis: She has antiemetics prescribed but has not needed to utilize them.  5.Thrombosis: Of the left lower extremity. She is currently on Lovenox and plan to continue for the time being.   6. Thrombocytopenia: resolved.  Will continue with treatment if her platelet count is above 90,000.  7. Hyperglycemia: Blood sugar have been on the better control lately  8. Followup: On 04/03/2014 for day 8 of cycle 7.  [facility], MD 6/23/201510:02 AM

## 2014-03-27 NOTE — Progress Notes (Signed)
Patient ANC: 1.0. Ok to treat per Dr. Alen Blew.

## 2014-03-27 NOTE — Patient Instructions (Signed)
[facility] Discharge Instructions for Patients Receiving Chemotherapy  Today you received the following chemotherapy agents: Abraxane and Gemzar.  To help prevent nausea and vomiting after your treatment, we encourage you to take your nausea medication: as directed.   If you develop nausea and vomiting that is not controlled by your nausea medication, call the clinic.   BELOW ARE SYMPTOMS THAT SHOULD BE REPORTED IMMEDIATELY:  *FEVER GREATER THAN 100.5 F  *CHILLS WITH OR WITHOUT FEVER  NAUSEA AND VOMITING THAT IS NOT CONTROLLED WITH YOUR NAUSEA MEDICATION  *UNUSUAL SHORTNESS OF BREATH  *UNUSUAL BRUISING OR BLEEDING  TENDERNESS IN MOUTH AND THROAT WITH OR WITHOUT PRESENCE OF ULCERS  *URINARY PROBLEMS  *BOWEL PROBLEMS  UNUSUAL RASH Items with * indicate a potential emergency and should be followed up as soon as possible.  Feel free to call the clinic you have any questions or concerns. The clinic phone number is [phone].    

## 2014-03-27 NOTE — Telephone Encounter (Signed)
gv adn printed appt sched and avs for pt foo June and July....gv pt barium

## 2014-03-28 ENCOUNTER — Encounter: Payer: Self-pay | Admitting: *Deleted

## 2014-03-28 NOTE — Progress Notes (Unsigned)
Per patient request, faxed o.v. Note and labs, from 03/27/14 to dr carol webb @ [facility] [phone].

## 2014-04-03 ENCOUNTER — Ambulatory Visit (HOSPITAL_BASED_OUTPATIENT_CLINIC_OR_DEPARTMENT_OTHER): Payer: PRIVATE HEALTH INSURANCE

## 2014-04-03 ENCOUNTER — Other Ambulatory Visit (HOSPITAL_BASED_OUTPATIENT_CLINIC_OR_DEPARTMENT_OTHER): Payer: PRIVATE HEALTH INSURANCE

## 2014-04-03 ENCOUNTER — Ambulatory Visit (HOSPITAL_BASED_OUTPATIENT_CLINIC_OR_DEPARTMENT_OTHER): Payer: PRIVATE HEALTH INSURANCE | Admitting: Physician Assistant

## 2014-04-03 ENCOUNTER — Telehealth: Payer: Self-pay | Admitting: Oncology

## 2014-04-03 ENCOUNTER — Encounter: Payer: Self-pay | Admitting: Physician Assistant

## 2014-04-03 VITALS — BP 86/60 | HR 98 | Temp 97.5°F | Resp 20 | Ht 65.0 in | Wt 140.7 lb

## 2014-04-03 DIAGNOSIS — C786 Secondary malignant neoplasm of retroperitoneum and peritoneum: Secondary | ICD-10-CM

## 2014-04-03 DIAGNOSIS — C250 Malignant neoplasm of head of pancreas: Secondary | ICD-10-CM

## 2014-04-03 DIAGNOSIS — Z5111 Encounter for antineoplastic chemotherapy: Secondary | ICD-10-CM

## 2014-04-03 DIAGNOSIS — C787 Secondary malignant neoplasm of liver and intrahepatic bile duct: Secondary | ICD-10-CM

## 2014-04-03 DIAGNOSIS — C252 Malignant neoplasm of tail of pancreas: Secondary | ICD-10-CM

## 2014-04-03 DIAGNOSIS — G609 Hereditary and idiopathic neuropathy, unspecified: Secondary | ICD-10-CM

## 2014-04-03 DIAGNOSIS — R109 Unspecified abdominal pain: Secondary | ICD-10-CM

## 2014-04-03 DIAGNOSIS — I82409 Acute embolism and thrombosis of unspecified deep veins of unspecified lower extremity: Secondary | ICD-10-CM

## 2014-04-03 DIAGNOSIS — C259 Malignant neoplasm of pancreas, unspecified: Secondary | ICD-10-CM

## 2014-04-03 LAB — CBC WITH DIFFERENTIAL/PLATELET
BASO%: 1.1 % (ref 0.0–2.0)
Basophils Absolute: 0 10*3/uL (ref 0.0–0.1)
EOS%: 0.4 % (ref 0.0–7.0)
Eosinophils Absolute: 0 10*3/uL (ref 0.0–0.5)
HCT: 36.1 % (ref 34.8–46.6)
HGB: 11.9 g/dL (ref 11.6–15.9)
LYMPH%: 53.5 % — AB (ref 14.0–49.7)
MCH: 30.8 pg (ref 25.1–34.0)
MCHC: 32.9 g/dL (ref 31.5–36.0)
MCV: 93.6 fL (ref 79.5–101.0)
MONO#: 0.4 10*3/uL (ref 0.1–0.9)
MONO%: 10.6 % (ref 0.0–14.0)
NEUT#: 1.4 10*3/uL — ABNORMAL LOW (ref 1.5–6.5)
NEUT%: 34.4 % — ABNORMAL LOW (ref 38.4–76.8)
PLATELETS: 206 10*3/uL (ref 145–400)
RBC: 3.86 10*6/uL (ref 3.70–5.45)
RDW: 17.7 % — AB (ref 11.2–14.5)
WBC: 4.1 10*3/uL (ref 3.9–10.3)
lymph#: 2.2 10*3/uL (ref 0.9–3.3)

## 2014-04-03 LAB — COMPREHENSIVE METABOLIC PANEL (CC13)
ALK PHOS: 93 U/L (ref 40–150)
ALT: 51 U/L (ref 0–55)
AST: 48 U/L — AB (ref 5–34)
Albumin: 3.3 g/dL — ABNORMAL LOW (ref 3.5–5.0)
Anion Gap: 13 mEq/L — ABNORMAL HIGH (ref 3–11)
BILIRUBIN TOTAL: 0.38 mg/dL (ref 0.20–1.20)
BUN: 16.4 mg/dL (ref 7.0–26.0)
CO2: 23 mEq/L (ref 22–29)
Calcium: 9.5 mg/dL (ref 8.4–10.4)
Chloride: 100 mEq/L (ref 98–109)
Creatinine: 0.9 mg/dL (ref 0.6–1.1)
Glucose: 142 mg/dl — ABNORMAL HIGH (ref 70–140)
Potassium: 3.7 mEq/L (ref 3.5–5.1)
Sodium: 136 mEq/L (ref 136–145)
TOTAL PROTEIN: 6.4 g/dL (ref 6.4–8.3)

## 2014-04-03 MED ORDER — SODIUM CHLORIDE 0.9 % IJ SOLN
10.0000 mL | INTRAMUSCULAR | Status: DC | PRN
  Administered 2014-04-03: 10 mL
  Filled 2014-04-03: qty 10

## 2014-04-03 MED ORDER — PACLITAXEL PROTEIN-BOUND CHEMO INJECTION 100 MG
125.0000 mg/m2 | Freq: Once | INTRAVENOUS | Status: AC
  Administered 2014-04-03: 225 mg via INTRAVENOUS
  Filled 2014-04-03: qty 45

## 2014-04-03 MED ORDER — SODIUM CHLORIDE 0.9 % IV SOLN
Freq: Once | INTRAVENOUS | Status: AC
  Administered 2014-04-03: 12:00:00 via INTRAVENOUS

## 2014-04-03 MED ORDER — DEXAMETHASONE SODIUM PHOSPHATE 10 MG/ML IJ SOLN
10.0000 mg | Freq: Once | INTRAMUSCULAR | Status: AC
  Administered 2014-04-03: 10 mg via INTRAVENOUS

## 2014-04-03 MED ORDER — ONDANSETRON 8 MG/50ML IVPB (CHCC)
8.0000 mg | Freq: Once | INTRAVENOUS | Status: AC
  Administered 2014-04-03: 8 mg via INTRAVENOUS

## 2014-04-03 MED ORDER — ONDANSETRON 8 MG/NS 50 ML IVPB
INTRAVENOUS | Status: AC
  Filled 2014-04-03: qty 8

## 2014-04-03 MED ORDER — HEPARIN SOD (PORK) LOCK FLUSH 100 UNIT/ML IV SOLN
500.0000 [IU] | Freq: Once | INTRAVENOUS | Status: AC | PRN
Start: 2014-04-03 — End: 2014-04-03
  Administered 2014-04-03: 500 [IU]
  Filled 2014-04-03: qty 5

## 2014-04-03 MED ORDER — SODIUM CHLORIDE 0.9 % IV SOLN
1000.0000 mg/m2 | Freq: Once | INTRAVENOUS | Status: AC
  Administered 2014-04-03: 1862 mg via INTRAVENOUS
  Filled 2014-04-03: qty 49

## 2014-04-03 MED ORDER — DEXAMETHASONE SODIUM PHOSPHATE 10 MG/ML IJ SOLN
INTRAMUSCULAR | Status: AC
  Filled 2014-04-03: qty 1

## 2014-04-03 NOTE — Telephone Encounter (Signed)
gv and printed pt new sched. °

## 2014-04-03 NOTE — Progress Notes (Addendum)
Hematology and Oncology Follow Up Visit  Christine Gould [id number] 01/23/1956 58 y.o. 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 This was diagnosed in February of 2015. Her CA 19-9 was 37,431.  Prior Therapy: She is status post endoscopic ultrasound and a biopsy completed on 11/07/2013 confirmed the presence of adenocarcinoma.  Current therapy: She is receiving palliative chemotherapy in the form of Gemzar and Abraxane given on day 1, day 8 of 21 day cycle. Status post 6 cycles Today is day 8 of cycle 7 of therapy.  Secondary diagnosis: She developed left deep vein thrombosis on 12/12/2013 and has been on Lovenox since.  Interim History:  Mrs. Christine Gould presents today for a followup visit with her daughter. Today she feels well and reports she tolerated the last cycle of chemotherapy relatively well. She does however requests a refill for her Zofran which had ulcer nausea well. There are planning a family trip to the beach the first week in August. She voiced no specific complaints today, benign fever, chills, current nausea or vomiting, diarrhea or constipation. She's had no night sweats or any significant weight loss.  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 This has not interfered with her ability to perform daily functions including buttoning her shirt as well as writing.  She has tolerated on Lovenox without any complications. She has no problem giving herself the injections and have not reported any pain or injection-related problems. She denies hematochezia or melena.She denied any decline in her performance status. She continues to note a continuous decrease in her abdominal distention and less abdominal  pain since the start of chemotherapy.She does have some occasional  abdominal pain/epigastric pain that is well-controlled with her oxycodone tablets.  She denied any alteration of mental status with confusion. She had not reported any syncope or any other complications. She has not reported any dizziness or seizure activity. Did not report any lymphadenopathy or petechiae. Does not report any skin rashes or lesions.  She has not reported any skeletal complaints or pains. Has not reported any mood disturbance. Rest of her review of systems unremarkable.  Medications: I have reviewed the patient's current medications.  Current Outpatient Prescriptions  Medication Sig Dispense Refill  . acetaminophen (TYLENOL) 500 MG tablet Take 1,000 mg by mouth every 6 (six) hours as needed for mild pain or moderate pain.      Marland Kitchen ALPRAZolam (XANAX) 0.5 MG tablet       . amLODipine (NORVASC) 10 MG tablet Take 10 mg by mouth every morning.      . ciprofloxacin (CIPRO) 500 MG tablet Take 1 tablet (500 mg total) by mouth 2 (two) times daily. For 7 days.  14 tablet  0  . citalopram (CELEXA) 20 MG tablet Take 20 mg by mouth every morning.      . enoxaparin (LOVENOX) 100 MG/ML injection Inject 1.1 mLs (110 mg total) into the skin daily.  90 Syringe  1  . glyBURIDE (DIABETA) 5 MG tablet Take 5 mg by mouth daily with breakfast.      . ibuprofen (ADVIL,MOTRIN) 200 MG tablet Take 400 mg by mouth every 6 (six) hours as needed for mild pain or moderate pain.      Marland Kitchen lidocaine-prilocaine (EMLA) cream Apply 1 application topically  daily as needed (port).      . metFORMIN (GLUCOPHAGE) 1000 MG tablet Take 1,000 mg by mouth 2 (two) times daily with a meal.      . olmesartan-hydrochlorothiazide (BENICAR HCT) 40-25 MG per tablet Take 1 tablet by mouth every morning.      Marland Kitchen oxyCODONE (OXY IR/ROXICODONE) 5 MG immediate release tablet Take 1 tablet (5 mg total) by mouth every 4 (four) hours as needed for severe pain.  30 tablet  0  .  prochlorperazine (COMPAZINE) 10 MG tablet Take 10 mg by mouth every 6 (six) hours as needed for nausea or vomiting.      . traZODone (DESYREL) 100 MG tablet Take 100 mg by mouth at bedtime.       No current facility-administered medications for this visit.   Facility-Administered Medications Ordered in Other Visits  Medication Dose Route Frequency Provider Last Rate Last Dose  . sodium chloride 0.9 % injection 10 mL  10 mL Intracatheter PRN Wyatt Portela, MD   10 mL at 04/03/14 1510     Allergies:  Allergies  Allergen Reactions  . Statins Other (See Comments)    Cramps-fatigue  . Toprol Xl [Metoprolol] Other (See Comments)    Pain     Past Medical History, Surgical history, Social history, and Family History were reviewed and updated.   Physical Exam: Blood pressure 86/60, pulse 98, temperature 97.5 F (36.4 C), temperature source Oral, resp. rate 20, height 5\' 5"  (1.651 m), weight 140 lb 11.2 oz (63.821 kg).  ECOG: 1 General appearance: alert Head: Normocephalic, without obvious abnormality Neck: no adenopathy Lymph nodes: Cervical, supraclavicular, and axillary nodes normal. Heart:regular rate and rhythm, S1, S2. Lung:chest clear, no wheezing, no dullness to percussion to Abdomin: soft, non-tender, without masses or organomegaly.No ascites or shifting dullness. EXT: 1+ edema on the left below the knee, trace on the right  Neurological examination: No deficits noted on today's exam  Lab Results: Lab Results  Component Value Date   WBC 4.1 04/03/2014   HGB 11.9 04/03/2014   HCT 36.1 04/03/2014   MCV 93.6 04/03/2014   PLT 206 04/03/2014     Chemistry      Component Value Date/Time   NA 136 04/03/2014 1016   K 3.7 04/03/2014 1016   CO2 23 04/03/2014 1016   BUN 16.4 04/03/2014 1016   CREATININE 0.9 04/03/2014 1016      Component Value Date/Time   CALCIUM 9.5 04/03/2014 1016   ALKPHOS 93 04/03/2014 1016   AST 48* 04/03/2014 1016   ALT 51 04/03/2014 1016   BILITOT 0.38  04/03/2014 1016        Impression and Plan:   58 year old woman with the following issues:   1. 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 She is receiving palliative chemotherapy in the form of Gemzar and Abraxane. She tolerated chemotherapy so far without any complications. Her ANC is slightly low today at 1.4 however we will proceed with day 8 of cycle #7 today as scheduled. CT scan from 01/19/2014  showed a dramatic response to systemic chemotherapy. The plan is to complete cycle 7 of chemotherapy and obtain a CT scan. This is scheduled for 04/17/2014.  2. Abdominal/epigastric pain: She is currently utilizing oxycodone with good relief. Pain has improved after the start of chemotherapy.  3. IV access: She has a Port-A-Cath in place and inserted without any complications.  4. Nausea prophylaxis: She  has antiemetics prescribed   5.Thrombosis: Of the left lower extremity. She is currently on Lovenox and the plan is to continue for the time being.   6. Thrombocytopenia: resolved. Will continue with treatment if her platelet count is above 90,000.  7. Hyperglycemia: Blood sugar have been on the better control lately  8. Followup: On 04/17/2014  To discuss the results of her restaging CT scan.  9. a refill for her Zofran was sent her pharmacy of record via E. scribe.  Carlton Adam, PA-C  6/30/20154:40 PM

## 2014-04-03 NOTE — Progress Notes (Signed)
OK to treat today with ANC 1.4, per Awilda Metro, PA-C. Myrtle, infusion RN made aware.

## 2014-04-05 NOTE — Patient Instructions (Signed)
Keep scheduled appointment for restaging CT scan Follow up with Dr. Alen Blew in 2 weeks to discuss the results of your restaging CT scan

## 2014-04-08 ENCOUNTER — Emergency Department (HOSPITAL_COMMUNITY)
Admission: EM | Admit: 2014-04-08 | Discharge: 2014-04-08 | Disposition: A | Discharge status: Home / Self Care | Payer: PRIVATE HEALTH INSURANCE | Attending: Emergency Medicine | Admitting: Emergency Medicine

## 2014-04-08 ENCOUNTER — Encounter (HOSPITAL_COMMUNITY): Payer: Self-pay | Admitting: Emergency Medicine

## 2014-04-08 DIAGNOSIS — R5383 Other fatigue: Secondary | ICD-10-CM

## 2014-04-08 DIAGNOSIS — Z79899 Other long term (current) drug therapy: Secondary | ICD-10-CM | POA: Insufficient documentation

## 2014-04-08 DIAGNOSIS — F329 Major depressive disorder, single episode, unspecified: Secondary | ICD-10-CM | POA: Insufficient documentation

## 2014-04-08 DIAGNOSIS — I10 Essential (primary) hypertension: Secondary | ICD-10-CM | POA: Insufficient documentation

## 2014-04-08 DIAGNOSIS — F17200 Nicotine dependence, unspecified, uncomplicated: Secondary | ICD-10-CM | POA: Insufficient documentation

## 2014-04-08 DIAGNOSIS — H8110 Benign paroxysmal vertigo, unspecified ear: Secondary | ICD-10-CM | POA: Insufficient documentation

## 2014-04-08 DIAGNOSIS — R42 Dizziness and giddiness: Secondary | ICD-10-CM

## 2014-04-08 DIAGNOSIS — F3289 Other specified depressive episodes: Secondary | ICD-10-CM | POA: Insufficient documentation

## 2014-04-08 DIAGNOSIS — Z8509 Personal history of malignant neoplasm of other digestive organs: Secondary | ICD-10-CM | POA: Insufficient documentation

## 2014-04-08 DIAGNOSIS — R5381 Other malaise: Secondary | ICD-10-CM | POA: Insufficient documentation

## 2014-04-08 DIAGNOSIS — E119 Type 2 diabetes mellitus without complications: Secondary | ICD-10-CM | POA: Insufficient documentation

## 2014-04-08 LAB — DIFFERENTIAL
BASOS PCT: 1 % (ref 0–1)
Basophils Absolute: 0 10*3/uL (ref 0.0–0.1)
EOS ABS: 0 10*3/uL (ref 0.0–0.7)
EOS PCT: 1 % (ref 0–5)
LYMPHS ABS: 1 10*3/uL (ref 0.7–4.0)
Lymphocytes Relative: 66 % — ABNORMAL HIGH (ref 12–46)
MONOS PCT: 2 % — AB (ref 3–12)
Monocytes Absolute: 0 10*3/uL — ABNORMAL LOW (ref 0.1–1.0)
Neutro Abs: 0.5 10*3/uL — ABNORMAL LOW (ref 1.7–7.7)
Neutrophils Relative %: 30 % — ABNORMAL LOW (ref 43–77)

## 2014-04-08 LAB — COMPREHENSIVE METABOLIC PANEL
ALT: 58 U/L — ABNORMAL HIGH (ref 0–35)
AST: 33 U/L (ref 0–37)
Albumin: 2.9 g/dL — ABNORMAL LOW (ref 3.5–5.2)
Alkaline Phosphatase: 82 U/L (ref 39–117)
Anion gap: 16 — ABNORMAL HIGH (ref 5–15)
BUN: 14 mg/dL (ref 6–23)
CALCIUM: 8.7 mg/dL (ref 8.4–10.5)
CO2: 23 mEq/L (ref 19–32)
CREATININE: 0.52 mg/dL (ref 0.50–1.10)
Chloride: 96 mEq/L (ref 96–112)
GFR calc Af Amer: >90 mL/min (ref >90)
Glucose, Bld: 279 mg/dL — ABNORMAL HIGH (ref 70–99)
Potassium: 3.4 mEq/L — ABNORMAL LOW (ref 3.7–5.3)
Sodium: 135 mEq/L — ABNORMAL LOW (ref 137–147)
Total Bilirubin: 0.3 mg/dL (ref 0.3–1.2)
Total Protein: 5.7 g/dL — ABNORMAL LOW (ref 6.0–8.3)

## 2014-04-08 LAB — CBC
HEMATOCRIT: 29.6 % — AB (ref 36.0–46.0)
HEMOGLOBIN: 10.3 g/dL — AB (ref 12.0–15.0)
MCH: 30.1 pg (ref 26.0–34.0)
MCHC: 34.8 g/dL (ref 30.0–36.0)
MCV: 86.5 fL (ref 78.0–100.0)
Platelets: 68 10*3/uL — ABNORMAL LOW (ref 150–400)
RBC: 3.42 MIL/uL — ABNORMAL LOW (ref 3.87–5.11)
RDW: 15.6 % — ABNORMAL HIGH (ref 11.5–15.5)
WBC: 1.6 10*3/uL — ABNORMAL LOW (ref 4.0–10.5)

## 2014-04-08 LAB — CBG MONITORING, ED: Glucose-Capillary: 239 mg/dL — ABNORMAL HIGH (ref 70–99)

## 2014-04-08 LAB — LIPASE, BLOOD: Lipase: 62 U/L — ABNORMAL HIGH (ref 11–59)

## 2014-04-08 LAB — URINALYSIS, ROUTINE W REFLEX MICROSCOPIC
Bilirubin Urine: NEGATIVE
Glucose, UA: 250 mg/dL — AB
HGB URINE DIPSTICK: NEGATIVE
KETONES UR: NEGATIVE mg/dL
Nitrite: NEGATIVE
PROTEIN: NEGATIVE mg/dL
Specific Gravity, Urine: 1.015 (ref 1.005–1.030)
Urobilinogen, UA: 0.2 mg/dL (ref 0.0–1.0)
pH: 6.5 (ref 5.0–8.0)

## 2014-04-08 LAB — URINE MICROSCOPIC-ADD ON

## 2014-04-08 MED ORDER — SODIUM CHLORIDE 0.9 % IV BOLUS (SEPSIS)
1000.0000 mL | Freq: Once | INTRAVENOUS | Status: AC
  Administered 2014-04-08: 1000 mL via INTRAVENOUS

## 2014-04-08 MED ORDER — HEPARIN SOD (PORK) LOCK FLUSH 100 UNIT/ML IV SOLN
500.0000 [IU] | Freq: Once | INTRAVENOUS | Status: AC
  Administered 2014-04-08: 500 [IU]
  Filled 2014-04-08: qty 5

## 2014-04-08 NOTE — ED Notes (Signed)
Pt c/o weakness and dizziness x 1 week. Pt states she is normally able to walk with walker but has not been for the past 3 of days and feels as if she is getting worse. Pt c/o nausea but no vomiting.

## 2014-04-08 NOTE — ED Provider Notes (Signed)
CSN: [id number]     Arrival date & time 04/08/14  1007 History   First MD Initiated Contact with Patient 04/08/14 1016     Chief Complaint  Patient presents with  . Dizziness  . Weakness     (Consider location/radiation/quality/duration/timing/severity/associated sxs/prior Treatment) Patient is a 58 y.o. female presenting with dizziness.  Dizziness Quality:  Lightheadedness Severity:  Severe Onset quality:  Gradual Duration:  1 week Timing:  Constant Progression:  Worsening Chronicity:  Recurrent Context comment:  Metastatic pancreatic cancer currently on chemotherapy. Last infusion 5 days ago. Relieved by:  Lying down Worsened by:  Sitting upright and standing up Ineffective treatments: Drinking fluids. Associated symptoms: nausea and weakness (Generalized)   Associated symptoms: no chest pain, no diarrhea, no shortness of breath, no syncope and no vomiting     Past Medical History  Diagnosis Date  . Hypertension   . Depression   . Diabetes mellitus without complication   . Cancer     Dx. 11-02-13 Pancreatic Cancer   Past Surgical History  Procedure Laterality Date  . Appendectomy    . Tubal ligation    . Cholecystectomy      open 70's  . Eus N/A 11/07/2013    Procedure: ESOPHAGEAL ENDOSCOPIC ULTRASOUND (EUS) RADIAL;  Surgeon: Arta Silence, MD;  Location: [facility];  Service: Endoscopy;  Laterality: N/A;  . Fine needle aspiration N/A 11/07/2013    Procedure: FINE NEEDLE ASPIRATION (FNA) LINEAR;  Surgeon: Arta Silence, MD;  Location: [facility];  Service: Endoscopy;  Laterality: N/A;   No family history on file. History  Substance Use Topics  . Smoking status: Current Every Day Smoker -- 0.50 packs/day for 35 years    Types: Cigarettes  . Smokeless tobacco: Not on file  . Alcohol Use: Yes     Comment: occ. -social   OB History   Grav Para Term Preterm Abortions TAB SAB Ect Mult Living                 Review of Systems  Respiratory: Negative for  shortness of breath.   Cardiovascular: Negative for chest pain and syncope.  Gastrointestinal: Positive for nausea. Negative for vomiting and diarrhea.  Neurological: Positive for dizziness.  All other systems reviewed and are negative.     Allergies  Statins and Toprol xl  Home Medications   Prior to Admission medications   Medication Sig Start Date End Date Taking? Authorizing Provider  acetaminophen (TYLENOL) 500 MG tablet Take 1,000 mg by mouth every 6 (six) hours as needed for mild pain or moderate pain.   Yes Historical Provider, MD  ALPRAZolam Duanne Moron) 0.5 MG tablet Take 0.5 mg by mouth 3 (three) times daily as needed.  11/08/13  Yes Historical Provider, MD  amLODipine (NORVASC) 10 MG tablet Take 10 mg by mouth every morning.   Yes Historical Provider, MD  citalopram (CELEXA) 20 MG tablet Take 20 mg by mouth every morning.   Yes Historical Provider, MD  enoxaparin (LOVENOX) 120 MG/0.8ML injection Inject 110 mg into the skin daily.   Yes Historical Provider, MD  glyBURIDE (DIABETA) 5 MG tablet Take 5 mg by mouth daily with breakfast.   Yes Historical Provider, MD  ibuprofen (ADVIL,MOTRIN) 200 MG tablet Take 400 mg by mouth every 6 (six) hours as needed for mild pain or moderate pain.   Yes Historical Provider, MD  lidocaine-prilocaine (EMLA) cream Apply 1 application topically daily as needed (port). 11/10/13  Yes Wyatt Portela, MD  metFORMIN (GLUCOPHAGE) 1000  MG tablet Take 1,000 mg by mouth 2 (two) times daily with a meal.   Yes Historical Provider, MD  olmesartan-hydrochlorothiazide (BENICAR HCT) 40-25 MG per tablet Take 1 tablet by mouth every morning.   Yes Historical Provider, MD  oxyCODONE (OXY IR/ROXICODONE) 5 MG immediate release tablet Take 1 tablet (5 mg total) by mouth every 4 (four) hours as needed for severe pain. 12/12/13  Yes Carlton Adam, PA-C  PRESCRIPTION MEDICATION Chemotherapy regimen   Yes Historical Provider, MD  prochlorperazine (COMPAZINE) 10 MG tablet Take  10 mg by mouth every 6 (six) hours as needed for nausea or vomiting. 11/10/13  Yes Wyatt Portela, MD  traZODone (DESYREL) 100 MG tablet Take 100 mg by mouth at bedtime.   Yes Historical Provider, MD   BP 109/79  Pulse 97  Temp(Src) 97.7 F (36.5 C) (Oral)  Resp 23  SpO2 100% Physical Exam  Nursing note and vitals reviewed. Constitutional: She is oriented to person, place, and time. She appears well-developed and well-nourished. No distress.  HENT:  Head: Normocephalic and atraumatic.  Mouth/Throat: Oropharynx is clear and moist.  Eyes: Conjunctivae are normal. Pupils are equal, round, and reactive to light. No scleral icterus.  Neck: Neck supple.  Cardiovascular: Normal rate, regular rhythm, normal heart sounds and intact distal pulses.   No murmur heard. Pulmonary/Chest: Effort normal and breath sounds normal. No stridor. No respiratory distress. She has no rales.  Abdominal: Soft. Bowel sounds are normal. She exhibits no distension. There is tenderness (Mild) in the epigastric area. There is no rigidity, no rebound and no guarding.  Musculoskeletal: Normal range of motion.  Neurological: She is alert and oriented to person, place, and time.  Skin: Skin is warm and dry. No rash noted.  Psychiatric: She has a normal mood and affect. Her behavior is normal.    ED Course  Procedures (including critical care time) Labs Review Labs Reviewed  CBC - Abnormal; Notable for the following:    WBC 1.6 (*)    RBC 3.42 (*)    Hemoglobin 10.3 (*)    HCT 29.6 (*)    RDW 15.6 (*)    Platelets 68 (*)    All other components within normal limits  LIPASE, BLOOD - Abnormal; Notable for the following:    Lipase 62 (*)    All other components within normal limits  COMPREHENSIVE METABOLIC PANEL - Abnormal; Notable for the following:    Sodium 135 (*)    Potassium 3.4 (*)    Glucose, Bld 279 (*)    Total Protein 5.7 (*)    Albumin 2.9 (*)    ALT 58 (*)    Anion gap 16 (*)    All other  components within normal limits  URINALYSIS, ROUTINE W REFLEX MICROSCOPIC - Abnormal; Notable for the following:    Glucose, UA 250 (*)    Leukocytes, UA TRACE (*)    All other components within normal limits  DIFFERENTIAL - Abnormal; Notable for the following:    Neutrophils Relative % 30 (*)    Neutro Abs 0.5 (*)    Lymphocytes Relative 66 (*)    Monocytes Relative 2 (*)    Monocytes Absolute 0.0 (*)    All other components within normal limits  CBG MONITORING, ED - Abnormal; Notable for the following:    Glucose-Capillary 239 (*)    All other components within normal limits  URINE MICROSCOPIC-ADD ON    Imaging Review No results found.   EKG Interpretation  Date/Time:  Sunday April 08 2014 10:23:52 EDT Ventricular Rate:  83 PR Interval:  147 QRS Duration: 86 QT Interval:  420 QTC Calculation: 493 R Axis:   63 Text Interpretation:  Sinus rhythm Low voltage, precordial leads  Borderline prolonged QT interval Baseline wander in lead(s) I II aVR aVL  aVF No old tracing to compare Confirmed by [facility]  MD, TREY ([id number]) on  04/08/2014 3:37:01 PM      MDM   Final diagnoses:  Positional lightheadedness    57 year old female with history of metastatic pancreatic cancer currently on chemotherapy who presents with lightheadedness, worse when sitting or standing. On exam, nontoxic appearance, in no distress.  Orthostatics were borderline positive. Her workup demonstrated relative leukopenia with absolute neutrophil count of 500. Also showed decrease in platelet count compared to prior. I discussed these with Dr. Benay Spice (Oncology) who felt that these blood counts were likely the expected result of her chemotherapy.  Otherwise, labs unremarkable.  She did not have any signs or symptoms of infectious etiology.  She felt much better after getting IV fluids.  She ambulated to the bathroom without feeling lightheaded.  She appears stable for outpatient treatment and will follow up with her  Oncologist.      [name], MD 04/08/14 1540

## 2014-04-08 NOTE — ED Notes (Signed)
Patient is aware that we need a urine specimen- will let us know when she is able to use the restroom

## 2014-04-08 NOTE — ED Notes (Signed)
Pt ambulated to restroom with steady gait. Pt reports that her dizziness is better and denies pain.

## 2014-04-08 NOTE — Discharge Instructions (Signed)

## 2014-04-17 ENCOUNTER — Encounter (HOSPITAL_COMMUNITY): Payer: Self-pay

## 2014-04-17 ENCOUNTER — Ambulatory Visit (HOSPITAL_COMMUNITY)
Admission: RE | Admit: 2014-04-17 | Discharge: 2014-04-17 | Disposition: A | Discharge status: Home / Self Care | Payer: PRIVATE HEALTH INSURANCE | Source: Ambulatory Visit | Attending: Oncology | Admitting: Oncology

## 2014-04-17 ENCOUNTER — Other Ambulatory Visit (HOSPITAL_BASED_OUTPATIENT_CLINIC_OR_DEPARTMENT_OTHER): Payer: PRIVATE HEALTH INSURANCE

## 2014-04-17 DIAGNOSIS — D7389 Other diseases of spleen: Secondary | ICD-10-CM | POA: Insufficient documentation

## 2014-04-17 DIAGNOSIS — C259 Malignant neoplasm of pancreas, unspecified: Secondary | ICD-10-CM | POA: Insufficient documentation

## 2014-04-17 DIAGNOSIS — K7689 Other specified diseases of liver: Secondary | ICD-10-CM | POA: Insufficient documentation

## 2014-04-17 DIAGNOSIS — C786 Secondary malignant neoplasm of retroperitoneum and peritoneum: Secondary | ICD-10-CM | POA: Insufficient documentation

## 2014-04-17 DIAGNOSIS — C252 Malignant neoplasm of tail of pancreas: Secondary | ICD-10-CM

## 2014-04-17 DIAGNOSIS — C250 Malignant neoplasm of head of pancreas: Secondary | ICD-10-CM

## 2014-04-17 DIAGNOSIS — M7989 Other specified soft tissue disorders: Secondary | ICD-10-CM | POA: Insufficient documentation

## 2014-04-17 LAB — CBC WITH DIFFERENTIAL/PLATELET
BASO%: 0.5 % (ref 0.0–2.0)
BASOS ABS: 0 10*3/uL (ref 0.0–0.1)
EOS ABS: 0.1 10*3/uL (ref 0.0–0.5)
EOS%: 2.7 % (ref 0.0–7.0)
HCT: 33.7 % — ABNORMAL LOW (ref 34.8–46.6)
HEMOGLOBIN: 11.3 g/dL — AB (ref 11.6–15.9)
LYMPH%: 44.9 % (ref 14.0–49.7)
MCH: 30.4 pg (ref 25.1–34.0)
MCHC: 33.5 g/dL (ref 31.5–36.0)
MCV: 90.6 fL (ref 79.5–101.0)
MONO#: 0.8 10*3/uL (ref 0.1–0.9)
MONO%: 19.9 % — ABNORMAL HIGH (ref 0.0–14.0)
NEUT%: 32 % — AB (ref 38.4–76.8)
NEUTROS ABS: 1.2 10*3/uL — AB (ref 1.5–6.5)
PLATELETS: 191 10*3/uL (ref 145–400)
RBC: 3.72 10*6/uL (ref 3.70–5.45)
RDW: 18 % — AB (ref 11.2–14.5)
WBC: 3.8 10*3/uL — ABNORMAL LOW (ref 3.9–10.3)
lymph#: 1.7 10*3/uL (ref 0.9–3.3)

## 2014-04-17 LAB — COMPREHENSIVE METABOLIC PANEL (CC13)
ALK PHOS: 91 U/L (ref 40–150)
ALT: 32 U/L (ref 0–55)
AST: 25 U/L (ref 5–34)
Albumin: 3.3 g/dL — ABNORMAL LOW (ref 3.5–5.0)
Anion Gap: 11 mEq/L (ref 3–11)
BILIRUBIN TOTAL: 0.37 mg/dL (ref 0.20–1.20)
BUN: 4.8 mg/dL — ABNORMAL LOW (ref 7.0–26.0)
CO2: 27 mEq/L (ref 22–29)
CREATININE: 0.7 mg/dL (ref 0.6–1.1)
Calcium: 9.6 mg/dL (ref 8.4–10.4)
Chloride: 103 mEq/L (ref 98–109)
GLUCOSE: 137 mg/dL (ref 70–140)
Potassium: 3.5 mEq/L (ref 3.5–5.1)
Sodium: 142 mEq/L (ref 136–145)
Total Protein: 6.2 g/dL — ABNORMAL LOW (ref 6.4–8.3)

## 2014-04-17 MED ORDER — IOHEXOL 300 MG/ML  SOLN
100.0000 mL | Freq: Once | INTRAMUSCULAR | Status: AC | PRN
  Administered 2014-04-17: 100 mL via INTRAVENOUS

## 2014-04-19 ENCOUNTER — Telehealth: Payer: Self-pay | Admitting: Oncology

## 2014-04-19 ENCOUNTER — Ambulatory Visit (HOSPITAL_BASED_OUTPATIENT_CLINIC_OR_DEPARTMENT_OTHER): Payer: PRIVATE HEALTH INSURANCE | Admitting: Oncology

## 2014-04-19 ENCOUNTER — Encounter: Payer: Self-pay | Admitting: Oncology

## 2014-04-19 ENCOUNTER — Encounter: Payer: Self-pay | Admitting: Medical Oncology

## 2014-04-19 VITALS — BP 93/60 | HR 83 | Temp 97.3°F | Resp 18 | Ht 65.0 in | Wt 149.3 lb

## 2014-04-19 DIAGNOSIS — C259 Malignant neoplasm of pancreas, unspecified: Secondary | ICD-10-CM

## 2014-04-19 DIAGNOSIS — C252 Malignant neoplasm of tail of pancreas: Secondary | ICD-10-CM

## 2014-04-19 DIAGNOSIS — I82409 Acute embolism and thrombosis of unspecified deep veins of unspecified lower extremity: Secondary | ICD-10-CM

## 2014-04-19 DIAGNOSIS — G609 Hereditary and idiopathic neuropathy, unspecified: Secondary | ICD-10-CM

## 2014-04-19 DIAGNOSIS — C787 Secondary malignant neoplasm of liver and intrahepatic bile duct: Secondary | ICD-10-CM

## 2014-04-19 DIAGNOSIS — C786 Secondary malignant neoplasm of retroperitoneum and peritoneum: Secondary | ICD-10-CM

## 2014-04-19 DIAGNOSIS — R109 Unspecified abdominal pain: Secondary | ICD-10-CM

## 2014-04-19 NOTE — Telephone Encounter (Signed)
Pt confirmed labs/ov/flush per 07/16 POF, gave pt AVS...Marland KitchenMarland KitchenKJ

## 2014-04-19 NOTE — Progress Notes (Signed)
Per patient request, last three office visit notes plus labs faxed to her PCP, Dr Maurice Small @ [phone]

## 2014-04-19 NOTE — Progress Notes (Signed)
Hematology and Oncology Follow Up Visit  Christine Gould [id number] 15-Aug-1956 58 y.o. 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 This was diagnosed in February of 2015. Her CA 19-9 was 37,431.  Prior Therapy: She is status post endoscopic ultrasound and a biopsy completed on 11/07/2013 confirmed the presence of adenocarcinoma.  Current therapy: She is receiving palliative chemotherapy in the form of Gemzar and Abraxane given on day 1, day 8 of 21 day cycle. Status post 7 cycles completed on 04/03/2014.  Secondary diagnosis: She developed left deep vein thrombosis on 12/12/2013 and has been on Lovenox since.  Interim History:  Mrs. Christine Gould presents today for a followup visit with her family. Since the last visit she has reported slight increase in her left lower extremity edema but no pain or tenderness. She does have some occasional nausea but does not require her to take her antiemetics medication no vomiting noted.  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 This has impacted her mobility at times as well as the use of her upper extremities..  She has tolerated on Lovenox without any complications. She has no problem giving herself the injections and have not reported any pain or injection-related problems. She denies hematochezia or melena.She denied any decline in her performance status. She is reporting a continuous decrease in her abdominal distention and less abdominal pain since the start of chemotherapy.She does have some occasional  abdominal pain/epigastric pain that is well-controlled with her oxycodone tablets.  She denied any alteration of mental status with confusion. She had not reported any syncope  or any other complications. She has not reported any dizziness or seizure activity. Did not report any lymphadenopathy or petechiae. Did not report any skin rashes or lesions. Her blood sugar have been under better control. She has not reported any skeletal complaints or pains. Has not reported any mood disturbance. Rest of her review of systems unremarkable.  Medications: I have reviewed the patient's current medications.  Current Outpatient Prescriptions  Medication Sig Dispense Refill  . acetaminophen (TYLENOL) 500 MG tablet Take 1,000 mg by mouth every 6 (six) hours as needed for mild pain or moderate pain.      Marland Kitchen ALPRAZolam (XANAX) 0.5 MG tablet Take 0.5 mg by mouth 3 (three) times daily as needed.       Marland Kitchen amLODipine (NORVASC) 10 MG tablet Take 10 mg by mouth every morning.      . citalopram (CELEXA) 20 MG tablet Take 20 mg by mouth every morning.      . enoxaparin (LOVENOX) 120 MG/0.8ML injection Inject 110 mg into the skin daily.      Marland Kitchen glyBURIDE (DIABETA) 5 MG tablet Take 5 mg by mouth daily with breakfast.      . ibuprofen (ADVIL,MOTRIN) 200 MG tablet Take 400 mg by mouth every 6 (six) hours as needed for mild pain or moderate pain.      Marland Kitchen lidocaine-prilocaine (EMLA) cream Apply 1 application topically daily as needed (port).      . metFORMIN (GLUCOPHAGE) 1000 MG tablet Take 1,000 mg by mouth 2 (two) times daily with a meal.      . olmesartan-hydrochlorothiazide (BENICAR HCT) 40-25 MG per tablet Take 1 tablet by mouth every morning.      Marland Kitchen  oxyCODONE (OXY IR/ROXICODONE) 5 MG immediate release tablet Take 1 tablet (5 mg total) by mouth every 4 (four) hours as needed for severe pain.  30 tablet  0  . PRESCRIPTION MEDICATION Chemotherapy regimen      . prochlorperazine (COMPAZINE) 10 MG tablet Take 10 mg by mouth every 6 (six) hours as needed for nausea or vomiting.      . traZODone (DESYREL) 100 MG tablet Take 100 mg by mouth at bedtime.       No current facility-administered medications  for this visit.     Allergies:  Allergies  Allergen Reactions  . Statins Other (See Comments)    Cramps-fatigue  . Toprol Xl [Metoprolol] Other (See Comments)    Pain, cramps, fatigue     Past Medical History, Surgical history, Social history, and Family History were reviewed and updated.   Physical Exam: Blood pressure 93/60, pulse 83, temperature 97.3 F (36.3 C), temperature source Oral, resp. rate 18, height 5\' 5"  (1.651 m), weight 149 lb 4.8 oz (67.722 kg).  ECOG: 1 General appearance: alert awake not in any distress. Head: Normocephalic, without obvious abnormality Neck: no adenopathy Lymph nodes: Cervical, supraclavicular, and axillary nodes normal. Heart:regular rate and rhythm, S1, S2. Lung:chest clear, no wheezing, no dullness to percussion to Abdomin: soft, non-tender, without masses or organomegaly.No ascites or shifting dullness. EXT: 2+ bilaterally. Neurological examination: No deficits noted on today's exam  Lab Results: Lab Results  Component Value Date   WBC 3.8* 04/17/2014   HGB 11.3* 04/17/2014   HCT 33.7* 04/17/2014   MCV 90.6 04/17/2014   PLT 191 04/17/2014     Chemistry      Component Value Date/Time   NA 142 04/17/2014 1042   NA 135* 04/08/2014 1130   K 3.5 04/17/2014 1042   K 3.4* 04/08/2014 1130   CL 96 04/08/2014 1130   CO2 27 04/17/2014 1042   CO2 23 04/08/2014 1130   BUN 4.8* 04/17/2014 1042   BUN 14 04/08/2014 1130   CREATININE 0.7 04/17/2014 1042   CREATININE 0.52 04/08/2014 1130      Component Value Date/Time   CALCIUM 9.6 04/17/2014 1042   CALCIUM 8.7 04/08/2014 1130   ALKPHOS 91 04/17/2014 1042   ALKPHOS 82 04/08/2014 1130   AST 25 04/17/2014 1042   AST 33 04/08/2014 1130   ALT 32 04/17/2014 1042   ALT 58* 04/08/2014 1130   BILITOT 0.37 04/17/2014 1042   BILITOT 0.3 04/08/2014 1130     EXAM:  CT ABDOMEN AND PELVIS WITH CONTRAST  TECHNIQUE:  Multidetector CT imaging of the abdomen and pelvis was performed  using the standard protocol following bolus  administration of  intravenous contrast.  CONTRAST: 13mL OMNIPAQUE IOHEXOL 300 MG/ML SOLN  COMPARISON: Prior examinations 01/19/2014 and 11/03/2013.  FINDINGS:  The lung bases are essentially clear. There is no significant  pleural or pericardial effusion.  The residual low-density mass involving the tail of the pancreas is  not significantly changed, measuring approximately 4.0 x 2.0 cm on  image 33 of series 2. The pancreatic head and body appear normal.  There is no pancreatic or biliary ductal dilatation status post  cholecystectomy. 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  Small cystic lesion inferiorly in the right hepatic lobe is stable.  The spleen appears normal. There is stable mild nodular thickening  of both adrenal glands. There is a stable right renal parapelvic  cyst. The left kidney appears normal. There  is no hydronephrosis.  There has been continued improvement in the previously demonstrated  peritoneal carcinomatosis. There is no residual measurable disease  adjacent to the splenic flexure of the colon. There is no ascites or  lymphadenopathy.  The stomach, small bowel and colon demonstrate no significant  findings. There is no evidence of bowel obstruction. The uterus,  ovaries and bladder appear stable. There is chronic bladder wall  thickening.  There are no worrisome osseous findings.  IMPRESSION:  1. Further regression in peritoneal carcinomatosis. There is mild  residual peritoneal thickening without measurable nodularity.  2. 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  3. No evidence of progressive metastatic disease.     Impression and Plan:   58 year old woman with the following issues:   1. Stage IV pancreatic adenocarcinoma arising from the tail of the pancreas with associated  lymphadenopathy, hepatic metastasis and peritoneal carcinomatosis. She is S/P palliative chemotherapy in the form of Gemzar and Abraxane.  CT scan from 04/17/2014  showed a dramatic response to systemic chemotherapy with very little residual cancer at this time. I plan on holding off any further chemotherapy at this time given her excellent response as well as accumulative neuropathy. I will repeat imaging studies in 3 months and consider restarting chemotherapy as needed.  2. Abdominal/epigastric pain: She is currently utilizing oxycodone with good relief.   3. IV access: She has a Port-A-Cath in place.This will be flushed every 6 weeks.  4. Nausea prophylaxis: She has antiemetics prescribed but has not needed to utilize them.  5.Thrombosis: Of the left lower extremity. She is currently on Lovenox and plan to continue for the time being.   6. Thrombocytopenia: resolved at this time. This was likely related to her chemotherapy.  7. Hyperglycemia: Blood sugar have been on the better control lately  8. Followup: In 6 weeks for a port flush and 3 months for a followup visit after a CT scan.  [facility], MD 7/16/201512:26 PM

## 2014-05-03 ENCOUNTER — Telehealth: Payer: Self-pay | Admitting: Medical Oncology

## 2014-05-03 NOTE — Telephone Encounter (Signed)
Patient call: requesting most recent labs and CT results to be faxed to Dr. Justin Mend. Fax sent per pt request.  Patient also requesting copy of CT report to be mailed to her home. Report placed in mail.  Patient to call office/clilnic with further questions/concerns.

## 2014-05-28 ENCOUNTER — Telehealth: Payer: Self-pay | Admitting: *Deleted

## 2014-05-28 NOTE — Telephone Encounter (Signed)
Patient called asking to pick up CT contrast on Thursday at 0900 from front desk while here for lab appointment.  Reports she was not given contrast when appointment was scheduled.  Scans will be in October.

## 2014-05-31 ENCOUNTER — Ambulatory Visit (HOSPITAL_BASED_OUTPATIENT_CLINIC_OR_DEPARTMENT_OTHER): Payer: PRIVATE HEALTH INSURANCE

## 2014-05-31 VITALS — BP 131/71 | HR 89 | Temp 98.0°F | Resp 18

## 2014-05-31 DIAGNOSIS — Z95828 Presence of other vascular implants and grafts: Secondary | ICD-10-CM

## 2014-05-31 DIAGNOSIS — Z452 Encounter for adjustment and management of vascular access device: Secondary | ICD-10-CM

## 2014-05-31 DIAGNOSIS — C786 Secondary malignant neoplasm of retroperitoneum and peritoneum: Secondary | ICD-10-CM

## 2014-05-31 DIAGNOSIS — C787 Secondary malignant neoplasm of liver and intrahepatic bile duct: Secondary | ICD-10-CM

## 2014-05-31 DIAGNOSIS — C252 Malignant neoplasm of tail of pancreas: Secondary | ICD-10-CM

## 2014-05-31 MED ORDER — HEPARIN SOD (PORK) LOCK FLUSH 100 UNIT/ML IV SOLN
500.0000 [IU] | Freq: Once | INTRAVENOUS | Status: AC
  Administered 2014-05-31: 500 [IU] via INTRAVENOUS
  Filled 2014-05-31: qty 5

## 2014-05-31 MED ORDER — SODIUM CHLORIDE 0.9 % IJ SOLN
10.0000 mL | INTRAMUSCULAR | Status: DC | PRN
  Administered 2014-05-31: 10 mL via INTRAVENOUS
  Filled 2014-05-31: qty 10

## 2014-05-31 NOTE — Patient Instructions (Signed)

## 2014-06-20 ENCOUNTER — Other Ambulatory Visit: Payer: Self-pay | Admitting: Oncology

## 2014-07-03 ENCOUNTER — Other Ambulatory Visit: Payer: Self-pay | Admitting: *Deleted

## 2014-07-03 ENCOUNTER — Encounter: Payer: Self-pay | Admitting: *Deleted

## 2014-07-03 NOTE — Progress Notes (Signed)
Patient calling to say she is feeling more pressure in her abdomen, fatigued, increased diarrhea. Per dr Alen Blew,  CT scan moved up to 07/05/14 @1 :00 and  visit with dr Alen Blew 07/06/14 @ 1:30. Patient aware and knows to drink contrast 2 hours before procedure and to be NPO 4 hours before.

## 2014-07-05 ENCOUNTER — Ambulatory Visit (HOSPITAL_COMMUNITY)
Admission: RE | Admit: 2014-07-05 | Discharge: 2014-07-05 | Disposition: A | Discharge status: Home / Self Care | Payer: PRIVATE HEALTH INSURANCE | Source: Ambulatory Visit | Attending: Oncology | Admitting: Oncology

## 2014-07-05 ENCOUNTER — Encounter (HOSPITAL_COMMUNITY): Payer: Self-pay

## 2014-07-05 DIAGNOSIS — Z08 Encounter for follow-up examination after completed treatment for malignant neoplasm: Secondary | ICD-10-CM | POA: Insufficient documentation

## 2014-07-05 DIAGNOSIS — C252 Malignant neoplasm of tail of pancreas: Secondary | ICD-10-CM | POA: Insufficient documentation

## 2014-07-05 DIAGNOSIS — C259 Malignant neoplasm of pancreas, unspecified: Secondary | ICD-10-CM

## 2014-07-05 DIAGNOSIS — C786 Secondary malignant neoplasm of retroperitoneum and peritoneum: Secondary | ICD-10-CM | POA: Insufficient documentation

## 2014-07-05 MED ORDER — IOHEXOL 300 MG/ML  SOLN
100.0000 mL | Freq: Once | INTRAMUSCULAR | Status: AC | PRN
  Administered 2014-07-05: 100 mL via INTRAVENOUS

## 2014-07-06 ENCOUNTER — Ambulatory Visit (HOSPITAL_BASED_OUTPATIENT_CLINIC_OR_DEPARTMENT_OTHER): Payer: PRIVATE HEALTH INSURANCE | Admitting: Oncology

## 2014-07-06 ENCOUNTER — Encounter: Payer: Self-pay | Admitting: Oncology

## 2014-07-06 ENCOUNTER — Telehealth: Payer: Self-pay | Admitting: Oncology

## 2014-07-06 VITALS — BP 131/84 | HR 85 | Temp 98.0°F | Resp 18 | Ht 65.0 in | Wt 141.9 lb

## 2014-07-06 DIAGNOSIS — C787 Secondary malignant neoplasm of liver and intrahepatic bile duct: Secondary | ICD-10-CM

## 2014-07-06 DIAGNOSIS — C252 Malignant neoplasm of tail of pancreas: Secondary | ICD-10-CM

## 2014-07-06 DIAGNOSIS — C786 Secondary malignant neoplasm of retroperitoneum and peritoneum: Secondary | ICD-10-CM

## 2014-07-06 DIAGNOSIS — I82409 Acute embolism and thrombosis of unspecified deep veins of unspecified lower extremity: Secondary | ICD-10-CM

## 2014-07-06 NOTE — Telephone Encounter (Signed)
gv adn printed appt sched and avs for pt for OCT and NOV....sed added tx. °

## 2014-07-06 NOTE — Progress Notes (Signed)
Per patient's request. CT scan results faxed to Dr. Maurice Small @ [phone].

## 2014-07-06 NOTE — Progress Notes (Signed)
Hematology and Oncology Follow Up Visit  Christine Gould [id number] 10-Nov-1955 58 y.o. 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 This was diagnosed in February of 2015. Her CA 19-9 was 37,431.  Prior Therapy: She is status post endoscopic ultrasound and a biopsy completed on 11/07/2013 confirmed the presence of adenocarcinoma.  Current therapy: She is receiving palliative chemotherapy in the form of Gemzar and Abraxane given on day 1, day 8 of 21 day cycle. Status post 7 cycles completed on 04/03/2014. Currently on a treatment break.  Secondary diagnosis: She developed left deep vein thrombosis on 12/12/2013 and has been on Lovenox since.  Interim History:  Christine Gould presents today for a followup visit with her family. Since the last visit, she did well up until the last 2-3 weeks. She started developing abdominal distention and satiety. She also noted abdominal fullness and diarrhea. She does have some occasional nausea and did vomit on occasions.  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 She has not reported any further neurological deficits or worsening of her neuropathy. She denies hematochezia or melena.She denied any decline in her performance status. She denied any alteration of mental status with confusion. She had not reported any syncope or any other complications. She has not reported any dizziness or seizure activity. Did not report any lymphadenopathy or petechiae. Did not report any skin rashes or lesions. Her blood sugar have been under better control. She has not reported any skeletal complaints or pains. Has not reported any mood disturbance. Rest of her review of systems  unremarkable.  Medications: I have reviewed the patient's current medications.  Current Outpatient Prescriptions  Medication Sig Dispense Refill  . acetaminophen (TYLENOL) 500 MG tablet Take 1,000 mg by mouth every 6 (six) hours as needed for mild pain or moderate pain.      Marland Kitchen ALPRAZolam (XANAX) 0.5 MG tablet Take 0.5 mg by mouth 3 (three) times daily as needed.       Marland Kitchen amLODipine (NORVASC) 10 MG tablet Take 10 mg by mouth every morning.      . citalopram (CELEXA) 20 MG tablet Take 40 mg by mouth every morning.       Marland Kitchen ibuprofen (ADVIL,MOTRIN) 200 MG tablet Take 400 mg by mouth every 6 (six) hours as needed for mild pain or moderate pain.      Marland Kitchen lidocaine-prilocaine (EMLA) cream Apply 1 application topically daily as needed (port).      . metFORMIN (GLUCOPHAGE) 1000 MG tablet Take 1,000 mg by mouth 2 (two) times daily with a meal.      . oxyCODONE (OXY IR/ROXICODONE) 5 MG immediate release tablet Take 1 tablet (5 mg total) by mouth every 4 (four) hours as needed for severe pain.  30 tablet  0  . PRESCRIPTION MEDICATION Chemotherapy regimen      . prochlorperazine (COMPAZINE) 10 MG tablet Take 10 mg by mouth every 6 (six) hours as needed for nausea or vomiting.      . prochlorperazine (COMPAZINE) 10 MG tablet TAKE 1 TABLET BY MOUTH EVERY 6 HOURS AS NEEDED FOR NAUSEA OR VOMITING  30 tablet  0  . traZODone (DESYREL) 100 MG tablet Take 100 mg by mouth at bedtime.       No current facility-administered medications  for this visit.     Allergies:  Allergies  Allergen Reactions  . Statins Other (See Comments)    Cramps-fatigue  . Toprol Xl [Metoprolol] Other (See Comments)    Pain, cramps, fatigue     Past Medical History, Surgical history, Social history, and Family History were reviewed and updated.   Physical Exam: Blood pressure 131/84, pulse 85, temperature 98 F (36.7 C), temperature source Oral, resp. rate 18, height 5\' 5"  (1.651 m), weight 141 lb 14.4 oz (64.365 kg).  ECOG:  1 General appearance: alert awake not in any distress. Head: Normocephalic, without obvious abnormality Neck: no adenopathy Lymph nodes: Cervical, supraclavicular, and axillary nodes normal. Heart:regular rate and rhythm, S1, S2. Lung:chest clear, no wheezing, no dullness to percussion to Abdomin: No ascites or shifting dullness. Tender on deep palpation. Good Bbowel sounds. EXT: 1+ bilaterally. Neurological examination: No deficits noted on today's exam  Lab Results: Lab Results  Component Value Date   WBC 3.8* 04/17/2014   HGB 11.3* 04/17/2014   HCT 33.7* 04/17/2014   MCV 90.6 04/17/2014   PLT 191 04/17/2014     Chemistry      Component Value Date/Time   NA 142 04/17/2014 1042   NA 135* 04/08/2014 1130   K 3.5 04/17/2014 1042   K 3.4* 04/08/2014 1130   CL 96 04/08/2014 1130   CO2 27 04/17/2014 1042   CO2 23 04/08/2014 1130   BUN 4.8* 04/17/2014 1042   BUN 14 04/08/2014 1130   CREATININE 0.7 04/17/2014 1042   CREATININE 0.52 04/08/2014 1130      Component Value Date/Time   CALCIUM 9.6 04/17/2014 1042   CALCIUM 8.7 04/08/2014 1130   ALKPHOS 91 04/17/2014 1042   ALKPHOS 82 04/08/2014 1130   AST 25 04/17/2014 1042   AST 33 04/08/2014 1130   ALT 32 04/17/2014 1042   ALT 58* 04/08/2014 1130   BILITOT 0.37 04/17/2014 1042   BILITOT 0.3 04/08/2014 1130      EXAM:  CT CHEST, ABDOMEN, AND PELVIS WITH CONTRAST  TECHNIQUE:  Multidetector CT imaging of the chest, abdomen and pelvis was  performed following the standard protocol during bolus  administration of intravenous contrast.  CONTRAST: 11mL OMNIPAQUE IOHEXOL 300 MG/ML SOLN  COMPARISON: CT 04/17/2014  FINDINGS:  CT CHEST FINDINGS  Right anterior chest wall Port-A-Cath is present with tip  terminating in the superior vena cava. Visualized thyroid is  unremarkable. There is a stable in 8 mm superior mediastinal lymph  node (image 14; series 2). Normal heart size. No pericardial  effusion. Normal caliber aorta and main pulmonary artery. Coronary   intra vascular calcifications.  The central airways are patent. There is a stable 2 mm nodule within  the left upper lobe (image 18; series 5). Focal atelectasis within  the lingula. Stable subpleural reticular nodular opacities within  the right lower lobe, potentially representing scarring. No large  consolidative pulmonary opacity. No pleural effusion or  pneumothorax.  CT ABDOMEN AND PELVIS FINDINGS  Hepatobiliary: The liver is normal in size and contour. Stable  subcentimeter low-attenuation lesion inferior aspect of the right  hepatic lobe. Status post cholecystectomy. No intrahepatic or  extrahepatic biliary ductal dilatation.  Pancreas: Grossly stable low-density mass involving the tail of the  pancreas measuring 2.7 x 2.2 cm (image 54; series 2), previously 2.6  x 2.0 cm when measured at the same location. The pancreatic head and  body are grossly unremarkable.  Spleen: Unremarkable  Adrenals/Urinary Tract: Stable thickening of the adrenal  glands.  Kidneys enhance symmetrically with contrast. Stable right parapelvic  cyst. No hydronephrosis.  Stomach/Bowel: No abnormal bowel wall thickening or evidence for  bowel obstruction.  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 Scattered calcified atherosclerotic  plaque involving the abdominal aorta.  Other: Interval development of a small amount of perihepatic fluid  as well as small amount of fluid in the pelvis. Interval increase in  size and development of multiple soft tissue implants within the  anterior abdomen compatible with peritoneal carcinomatosis. A  conglomerate of soft tissue within the left anterior hemiabdomen  measures 7.4 x 1.5 cm (image 82; series 2). Additional implants are  demonstrated throughout the abdomen and into left upper quadrant.  Musculoskeletal: No aggressive or acute appearing osseous lesions.  IMPRESSION:  Interval progression of peritoneal  carcinomatosis with increasing  soft tissue implants and nodularity.  Grossly stable size of low-density mass involving the pancreatic  tail. 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   Impression and Plan:   58 year old woman with the following issues:   1. 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 She is S/P palliative chemotherapy in the form of Gemzar and Abraxane.  CT scan from 04/17/2014  showed a dramatic response to systemic chemotherapy with very little residual cancer. She did well to recently and a followup CT scan on 07/05/2014 which was reviewed today show progression of disease. Given these new findings, she'll need to resume systemic chemotherapy. Options of treatment were discussed extensively. These would include utilizing Gemzar alone, Gemzar and Abraxane or other agents. I feel that combination of Gemzar and Abraxane have helped her dramatically and it's reasonable to resume it was lowering the Abraxane dose given her neuropathy. I plan on starting this on 07/10/2014. Risks and benefits of systemic chemotherapy was discussed again and he is willing to proceed. Chemotherapy will given on day 1, day 8 of a 21 day cycle.  2. Abdominal/epigastric pain: She is currently utilizing oxycodone with good relief.   3. IV access: She has a Port-A-Cath in place.this will be utilized for systemic chemotherapy.  4. Nausea prophylaxis: She has antiemetics prescribed and have used them recently.  5.Thrombosis: Of the left lower extremity. She is currently on Lovenox and plan to continue for the time being.   6. Hyperglycemia: Blood sugar have been on the better control lately  7. Followup: 07/10/2014 for Chemotherapy. 10/13 for Mid level and Chemotherapy.   XJOITG,PQDIY, MD 10/2/20151:35 PM

## 2014-07-09 ENCOUNTER — Telehealth: Payer: Self-pay | Admitting: *Deleted

## 2014-07-09 ENCOUNTER — Telehealth: Payer: Self-pay | Admitting: Medical Oncology

## 2014-07-09 ENCOUNTER — Telehealth: Payer: Self-pay | Admitting: Oncology

## 2014-07-09 ENCOUNTER — Other Ambulatory Visit: Payer: Self-pay | Admitting: Medical Oncology

## 2014-07-09 NOTE — Telephone Encounter (Signed)
Patient calling to clarify appts. Gave patient updated scheduled appts. Patient denies further questions at this time.

## 2014-07-09 NOTE — Telephone Encounter (Signed)
Lft msg for pt confirming labs added 10/06 per 10/05 POF...Marland KitchenMarland KitchenMarland Kitchen KJ

## 2014-07-09 NOTE — Telephone Encounter (Signed)
Per staff message and POF I have adjusted scheduled appts. Advised scheduler of appts. JMW

## 2014-07-10 ENCOUNTER — Ambulatory Visit (HOSPITAL_BASED_OUTPATIENT_CLINIC_OR_DEPARTMENT_OTHER): Payer: PRIVATE HEALTH INSURANCE

## 2014-07-10 ENCOUNTER — Ambulatory Visit: Payer: PRIVATE HEALTH INSURANCE

## 2014-07-10 ENCOUNTER — Other Ambulatory Visit (HOSPITAL_BASED_OUTPATIENT_CLINIC_OR_DEPARTMENT_OTHER): Payer: PRIVATE HEALTH INSURANCE

## 2014-07-10 VITALS — BP 134/77 | HR 81 | Temp 97.6°F | Resp 16

## 2014-07-10 DIAGNOSIS — C786 Secondary malignant neoplasm of retroperitoneum and peritoneum: Secondary | ICD-10-CM

## 2014-07-10 DIAGNOSIS — Z5111 Encounter for antineoplastic chemotherapy: Secondary | ICD-10-CM

## 2014-07-10 DIAGNOSIS — C250 Malignant neoplasm of head of pancreas: Secondary | ICD-10-CM

## 2014-07-10 DIAGNOSIS — C252 Malignant neoplasm of tail of pancreas: Secondary | ICD-10-CM

## 2014-07-10 DIAGNOSIS — Z95828 Presence of other vascular implants and grafts: Secondary | ICD-10-CM

## 2014-07-10 DIAGNOSIS — C787 Secondary malignant neoplasm of liver and intrahepatic bile duct: Secondary | ICD-10-CM

## 2014-07-10 LAB — COMPREHENSIVE METABOLIC PANEL (CC13)
ALT: 26 U/L (ref 0–55)
AST: 23 U/L (ref 5–34)
Albumin: 3.4 g/dL — ABNORMAL LOW (ref 3.5–5.0)
Alkaline Phosphatase: 95 U/L (ref 40–150)
Anion Gap: 9 mEq/L (ref 3–11)
BUN: 4.4 mg/dL — ABNORMAL LOW (ref 7.0–26.0)
CHLORIDE: 102 meq/L (ref 98–109)
CO2: 27 mEq/L (ref 22–29)
Calcium: 9.6 mg/dL (ref 8.4–10.4)
Creatinine: 0.7 mg/dL (ref 0.6–1.1)
Glucose: 203 mg/dl — ABNORMAL HIGH (ref 70–140)
Potassium: 3.4 mEq/L — ABNORMAL LOW (ref 3.5–5.1)
SODIUM: 138 meq/L (ref 136–145)
Total Bilirubin: 0.46 mg/dL (ref 0.20–1.20)
Total Protein: 6.6 g/dL (ref 6.4–8.3)

## 2014-07-10 LAB — CBC WITH DIFFERENTIAL/PLATELET
BASO%: 0.9 % (ref 0.0–2.0)
BASOS ABS: 0 10*3/uL (ref 0.0–0.1)
EOS%: 1.3 % (ref 0.0–7.0)
Eosinophils Absolute: 0.1 10*3/uL (ref 0.0–0.5)
HCT: 41.2 % (ref 34.8–46.6)
HEMOGLOBIN: 13.4 g/dL (ref 11.6–15.9)
LYMPH%: 25 % (ref 14.0–49.7)
MCH: 30 pg (ref 25.1–34.0)
MCHC: 32.4 g/dL (ref 31.5–36.0)
MCV: 92.4 fL (ref 79.5–101.0)
MONO#: 0.5 10*3/uL (ref 0.1–0.9)
MONO%: 9.2 % (ref 0.0–14.0)
NEUT#: 3.2 10*3/uL (ref 1.5–6.5)
NEUT%: 63.6 % (ref 38.4–76.8)
Platelets: 161 10*3/uL (ref 145–400)
RBC: 4.46 10*6/uL (ref 3.70–5.45)
RDW: 14.5 % (ref 11.2–14.5)
WBC: 5 10*3/uL (ref 3.9–10.3)
lymph#: 1.2 10*3/uL (ref 0.9–3.3)

## 2014-07-10 MED ORDER — ONDANSETRON 8 MG/50ML IVPB (CHCC)
8.0000 mg | Freq: Once | INTRAVENOUS | Status: AC
  Administered 2014-07-10: 8 mg via INTRAVENOUS

## 2014-07-10 MED ORDER — HEPARIN SOD (PORK) LOCK FLUSH 100 UNIT/ML IV SOLN
500.0000 [IU] | Freq: Once | INTRAVENOUS | Status: DC | PRN
  Filled 2014-07-10: qty 5

## 2014-07-10 MED ORDER — DEXAMETHASONE SODIUM PHOSPHATE 10 MG/ML IJ SOLN
10.0000 mg | Freq: Once | INTRAMUSCULAR | Status: AC
  Administered 2014-07-10: 10 mg via INTRAVENOUS

## 2014-07-10 MED ORDER — SODIUM CHLORIDE 0.9 % IJ SOLN
10.0000 mL | INTRAMUSCULAR | Status: DC | PRN
  Filled 2014-07-10: qty 10

## 2014-07-10 MED ORDER — SODIUM CHLORIDE 0.9 % IV SOLN
1000.0000 mg/m2 | Freq: Once | INTRAVENOUS | Status: AC
  Administered 2014-07-10: 1862 mg via INTRAVENOUS
  Filled 2014-07-10: qty 48.97

## 2014-07-10 MED ORDER — PACLITAXEL PROTEIN-BOUND CHEMO INJECTION 100 MG
100.0000 mg/m2 | Freq: Once | INTRAVENOUS | Status: AC
  Administered 2014-07-10: 175 mg via INTRAVENOUS
  Filled 2014-07-10: qty 35

## 2014-07-10 MED ORDER — SODIUM CHLORIDE 0.9 % IV SOLN
Freq: Once | INTRAVENOUS | Status: AC
  Administered 2014-07-10: 14:00:00 via INTRAVENOUS

## 2014-07-10 MED ORDER — SODIUM CHLORIDE 0.9 % IJ SOLN
10.0000 mL | INTRAMUSCULAR | Status: DC | PRN
  Administered 2014-07-10: 10 mL via INTRAVENOUS
  Filled 2014-07-10: qty 10

## 2014-07-10 NOTE — Patient Instructions (Signed)
[place] Discharge Instructions for Patients Receiving Chemotherapy  Today you received the following chemotherapy agents Gemzar, Abraxane.  To help prevent nausea and vomiting after your treatment, we encourage you to take your nausea medication as directed.   If you develop nausea and vomiting that is not controlled by your nausea medication, call the clinic.   BELOW ARE SYMPTOMS THAT SHOULD BE REPORTED IMMEDIATELY:  *FEVER GREATER THAN 100.5 F  *CHILLS WITH OR WITHOUT FEVER  NAUSEA AND VOMITING THAT IS NOT CONTROLLED WITH YOUR NAUSEA MEDICATION  *UNUSUAL SHORTNESS OF BREATH  *UNUSUAL BRUISING OR BLEEDING  TENDERNESS IN MOUTH AND THROAT WITH OR WITHOUT PRESENCE OF ULCERS  *URINARY PROBLEMS  *BOWEL PROBLEMS  UNUSUAL RASH Items with * indicate a potential emergency and should be followed up as soon as possible.  Feel free to call the clinic you have any questions or concerns. The clinic phone number is [phone].

## 2014-07-10 NOTE — Patient Instructions (Signed)

## 2014-07-17 ENCOUNTER — Ambulatory Visit (HOSPITAL_BASED_OUTPATIENT_CLINIC_OR_DEPARTMENT_OTHER): Payer: PRIVATE HEALTH INSURANCE

## 2014-07-17 ENCOUNTER — Telehealth: Payer: Self-pay | Admitting: Oncology

## 2014-07-17 ENCOUNTER — Ambulatory Visit (HOSPITAL_BASED_OUTPATIENT_CLINIC_OR_DEPARTMENT_OTHER): Payer: PRIVATE HEALTH INSURANCE | Admitting: Physician Assistant

## 2014-07-17 ENCOUNTER — Encounter: Payer: Self-pay | Admitting: Physician Assistant

## 2014-07-17 ENCOUNTER — Other Ambulatory Visit (HOSPITAL_BASED_OUTPATIENT_CLINIC_OR_DEPARTMENT_OTHER): Payer: PRIVATE HEALTH INSURANCE

## 2014-07-17 ENCOUNTER — Ambulatory Visit: Payer: PRIVATE HEALTH INSURANCE

## 2014-07-17 VITALS — BP 144/73 | HR 97 | Temp 98.2°F | Resp 18 | Ht 65.0 in | Wt 138.2 lb

## 2014-07-17 DIAGNOSIS — Z95828 Presence of other vascular implants and grafts: Secondary | ICD-10-CM

## 2014-07-17 DIAGNOSIS — C787 Secondary malignant neoplasm of liver and intrahepatic bile duct: Secondary | ICD-10-CM

## 2014-07-17 DIAGNOSIS — C250 Malignant neoplasm of head of pancreas: Secondary | ICD-10-CM

## 2014-07-17 DIAGNOSIS — I82402 Acute embolism and thrombosis of unspecified deep veins of left lower extremity: Secondary | ICD-10-CM

## 2014-07-17 DIAGNOSIS — C252 Malignant neoplasm of tail of pancreas: Secondary | ICD-10-CM

## 2014-07-17 DIAGNOSIS — C786 Secondary malignant neoplasm of retroperitoneum and peritoneum: Secondary | ICD-10-CM

## 2014-07-17 DIAGNOSIS — R739 Hyperglycemia, unspecified: Secondary | ICD-10-CM

## 2014-07-17 DIAGNOSIS — R109 Unspecified abdominal pain: Secondary | ICD-10-CM

## 2014-07-17 DIAGNOSIS — Z5111 Encounter for antineoplastic chemotherapy: Secondary | ICD-10-CM

## 2014-07-17 LAB — COMPREHENSIVE METABOLIC PANEL (CC13)
ALBUMIN: 3.2 g/dL — AB (ref 3.5–5.0)
ALT: 82 U/L — AB (ref 0–55)
AST: 87 U/L — ABNORMAL HIGH (ref 5–34)
Alkaline Phosphatase: 177 U/L — ABNORMAL HIGH (ref 40–150)
Anion Gap: 13 mEq/L — ABNORMAL HIGH (ref 3–11)
BUN: 5 mg/dL — ABNORMAL LOW (ref 7.0–26.0)
CO2: 24 mEq/L (ref 22–29)
Calcium: 9.8 mg/dL (ref 8.4–10.4)
Chloride: 101 mEq/L (ref 98–109)
Creatinine: 0.7 mg/dL (ref 0.6–1.1)
GLUCOSE: 186 mg/dL — AB (ref 70–140)
POTASSIUM: 3.4 meq/L — AB (ref 3.5–5.1)
SODIUM: 139 meq/L (ref 136–145)
Total Bilirubin: 0.53 mg/dL (ref 0.20–1.20)
Total Protein: 6.9 g/dL (ref 6.4–8.3)

## 2014-07-17 LAB — CBC WITH DIFFERENTIAL/PLATELET
BASO%: 0.5 % (ref 0.0–2.0)
Basophils Absolute: 0 10*3/uL (ref 0.0–0.1)
EOS ABS: 0 10*3/uL (ref 0.0–0.5)
EOS%: 0.2 % (ref 0.0–7.0)
HCT: 39.7 % (ref 34.8–46.6)
HGB: 12.9 g/dL (ref 11.6–15.9)
LYMPH%: 30.6 % (ref 14.0–49.7)
MCH: 29.8 pg (ref 25.1–34.0)
MCHC: 32.5 g/dL (ref 31.5–36.0)
MCV: 91.9 fL (ref 79.5–101.0)
MONO#: 0.4 10*3/uL (ref 0.1–0.9)
MONO%: 9.3 % (ref 0.0–14.0)
NEUT#: 2.8 10*3/uL (ref 1.5–6.5)
NEUT%: 59.4 % (ref 38.4–76.8)
Platelets: 99 10*3/uL — ABNORMAL LOW (ref 145–400)
RBC: 4.32 10*6/uL (ref 3.70–5.45)
RDW: 14.1 % (ref 11.2–14.5)
WBC: 4.7 10*3/uL (ref 3.9–10.3)
lymph#: 1.5 10*3/uL (ref 0.9–3.3)

## 2014-07-17 MED ORDER — DEXAMETHASONE SODIUM PHOSPHATE 10 MG/ML IJ SOLN
INTRAMUSCULAR | Status: AC
  Filled 2014-07-17: qty 1

## 2014-07-17 MED ORDER — HEPARIN SOD (PORK) LOCK FLUSH 100 UNIT/ML IV SOLN
500.0000 [IU] | Freq: Once | INTRAVENOUS | Status: AC | PRN
  Administered 2014-07-17: 500 [IU]
  Filled 2014-07-17: qty 5

## 2014-07-17 MED ORDER — SODIUM CHLORIDE 0.9 % IJ SOLN
10.0000 mL | INTRAMUSCULAR | Status: DC | PRN
  Administered 2014-07-17: 10 mL via INTRAVENOUS
  Filled 2014-07-17: qty 10

## 2014-07-17 MED ORDER — PACLITAXEL PROTEIN-BOUND CHEMO INJECTION 100 MG
100.0000 mg/m2 | Freq: Once | INTRAVENOUS | Status: AC
  Administered 2014-07-17: 175 mg via INTRAVENOUS
  Filled 2014-07-17: qty 35

## 2014-07-17 MED ORDER — SODIUM CHLORIDE 0.9 % IJ SOLN
10.0000 mL | INTRAMUSCULAR | Status: DC | PRN
  Administered 2014-07-17: 10 mL
  Filled 2014-07-17: qty 10

## 2014-07-17 MED ORDER — ONDANSETRON 8 MG/50ML IVPB (CHCC)
8.0000 mg | Freq: Once | INTRAVENOUS | Status: AC
  Administered 2014-07-17: 8 mg via INTRAVENOUS

## 2014-07-17 MED ORDER — ONDANSETRON 8 MG/NS 50 ML IVPB
INTRAVENOUS | Status: AC
  Filled 2014-07-17: qty 8

## 2014-07-17 MED ORDER — SODIUM CHLORIDE 0.9 % IV SOLN
Freq: Once | INTRAVENOUS | Status: AC
  Administered 2014-07-17: 16:00:00 via INTRAVENOUS

## 2014-07-17 MED ORDER — DEXAMETHASONE SODIUM PHOSPHATE 10 MG/ML IJ SOLN
10.0000 mg | Freq: Once | INTRAMUSCULAR | Status: AC
  Administered 2014-07-17: 10 mg via INTRAVENOUS

## 2014-07-17 MED ORDER — SODIUM CHLORIDE 0.9 % IV SOLN
1000.0000 mg/m2 | Freq: Once | INTRAVENOUS | Status: AC
  Administered 2014-07-17: 1862 mg via INTRAVENOUS
  Filled 2014-07-17: qty 48.97

## 2014-07-17 NOTE — Progress Notes (Signed)
Hematology and Oncology Follow Up Visit  Christine Gould [id number] 10-24-1955 58 y.o. 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 This was diagnosed in February of 2015. Her CA 19-9 was 37,431.  Prior Therapy: She is status post endoscopic ultrasound and a biopsy completed on 11/07/2013 confirmed the presence of adenocarcinoma.  Current therapy: She is receiving palliative chemotherapy in the form of Gemzar and Abraxane given on day 1, day 8 of 21 day cycle. Status post 7 cycles completed on 04/03/2014. She resumed this same treatment on 10/06/2015Currently on a treatment break.  Secondary diagnosis: She developed left deep vein thrombosis on 12/12/2013 and has been on Lovenox since.  Interim History:  [name] presents today for a followup visit with her husband. She tolerated chemotherapy relatively well except for nausea that is not currently well-controlled with her Compazine. Upon further questioning, the patient is only taking 1 tablet in a 24-hour period. 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 She has not reported any further neurological deficits or worsening of her neuropathy. She denies hematochezia or melena.She denied any decline in her performance status. She denied any alteration of mental status with confusion. She had not reported any syncope or any other complications. She has not reported any dizziness or seizure activity. Did not report any lymphadenopathy or petechiae. Did not report any skin rashes or lesions.  She denied any skeletal complaints or pains. Has not reported any mood disturbance. Remainder of her review of systems unremarkable.  Medications: I have reviewed the patient's  current medications.  Current Outpatient Prescriptions  Medication Sig Dispense Refill  . acetaminophen (TYLENOL) 500 MG tablet Take 1,000 mg by mouth every 6 (six) hours as needed for mild pain or moderate pain.      Marland Kitchen ALPRAZolam (XANAX) 0.5 MG tablet Take 0.5 mg by mouth 3 (three) times daily as needed.       Marland Kitchen amLODipine (NORVASC) 10 MG tablet Take 10 mg by mouth every morning.      . citalopram (CELEXA) 20 MG tablet Take 40 mg by mouth every morning.       Marland Kitchen ibuprofen (ADVIL,MOTRIN) 200 MG tablet Take 400 mg by mouth every 6 (six) hours as needed for mild pain or moderate pain.      Marland Kitchen lidocaine-prilocaine (EMLA) cream Apply 1 application topically daily as needed (port).      . metFORMIN (GLUCOPHAGE) 1000 MG tablet Take 1,000 mg by mouth 2 (two) times daily with a meal.      . oxyCODONE (OXY IR/ROXICODONE) 5 MG immediate release tablet Take 1 tablet (5 mg total) by mouth every 4 (four) hours as needed for severe pain.  30 tablet  0  . PRESCRIPTION MEDICATION Chemotherapy regimen      . prochlorperazine (COMPAZINE) 10 MG tablet TAKE 1 TABLET BY MOUTH EVERY 6 HOURS AS NEEDED FOR NAUSEA OR VOMITING  30 tablet  0  . traZODone (DESYREL) 100 MG tablet Take 100 mg by mouth at bedtime.       No current facility-administered medications for this visit.   Facility-Administered Medications Ordered in Other Visits  Medication Dose Route Frequency Provider Last Rate Last Dose  . Gemcitabine HCl (GEMZAR) 1,862 mg in sodium chloride 0.9 % 100 mL chemo infusion  1,000 mg/m2 (Treatment Plan Actual) Intravenous Once Wyatt Portela, MD 298 mL/hr at 07/17/14 1721 1,862 mg at 07/17/14 1721  . heparin lock flush 100 unit/mL  500 Units Intracatheter Once PRN Wyatt Portela, MD      . sodium chloride 0.9 % injection 10 mL  10 mL Intracatheter PRN Wyatt Portela, MD         Allergies:  Allergies  Allergen Reactions  . Statins Other (See Comments)    Cramps-fatigue  . Toprol Xl [Metoprolol] Other (See  Comments)    Pain, cramps, fatigue     Past Medical History, Surgical history, Social history, and Family History were reviewed and updated.   Physical Exam: Blood pressure 144/73, pulse 97, temperature 98.2 F (36.8 C), temperature source Oral, resp. rate 18, height 5\' 5"  (1.651 m), weight 138 lb 3.2 oz (62.687 kg), SpO2 100.00%.  ECOG: 1 General appearance: alert awake not in any distress. Head: Normocephalic, without obvious abnormality Neck: no adenopathy Lymph nodes: Cervical, supraclavicular, and axillary nodes normal. Heart:regular rate and rhythm, S1, S2. Lung:chest clear, no wheezing, no dullness to percussion to Abdomin: No ascites or shifting dullness. Tender on deep palpation. Good Bbowel sounds. EXT: 1+ bilaterally. Neurological examination: No deficits noted on today's exam  Lab Results: Lab Results  Component Value Date   WBC 4.7 07/17/2014   HGB 12.9 07/17/2014   HCT 39.7 07/17/2014   MCV 91.9 07/17/2014   PLT 99* 07/17/2014     Chemistry      Component Value Date/Time   NA 139 07/17/2014 1359   NA 135* 04/08/2014 1130   K 3.4* 07/17/2014 1359   K 3.4* 04/08/2014 1130   CL 96 04/08/2014 1130   CO2 24 07/17/2014 1359   CO2 23 04/08/2014 1130   BUN 5.0* 07/17/2014 1359   BUN 14 04/08/2014 1130   CREATININE 0.7 07/17/2014 1359   CREATININE 0.52 04/08/2014 1130      Component Value Date/Time   CALCIUM 9.8 07/17/2014 1359   CALCIUM 8.7 04/08/2014 1130   ALKPHOS 177* 07/17/2014 1359   ALKPHOS 82 04/08/2014 1130   AST 87* 07/17/2014 1359   AST 33 04/08/2014 1130   ALT 82* 07/17/2014 1359   ALT 58* 04/08/2014 1130   BILITOT 0.53 07/17/2014 1359   BILITOT 0.3 04/08/2014 1130      EXAM:  CT CHEST, ABDOMEN, AND PELVIS WITH CONTRAST  TECHNIQUE:  Multidetector CT imaging of the chest, abdomen and pelvis was  performed following the standard protocol during bolus  administration of intravenous contrast.  CONTRAST: 162mL OMNIPAQUE IOHEXOL 300 MG/ML SOLN  COMPARISON: CT  04/17/2014  FINDINGS:  CT CHEST FINDINGS  Right anterior chest wall Port-A-Cath is present with tip  terminating in the superior vena cava. Visualized thyroid is  unremarkable. There is a stable in 8 mm superior mediastinal lymph  node (image 14; series 2). Normal heart size. No pericardial  effusion. Normal caliber aorta and main pulmonary artery. Coronary  intra vascular calcifications.  The central airways are patent. There is a stable 2 mm nodule within  the left upper lobe (image 18; series 5). Focal atelectasis within  the lingula. Stable subpleural reticular nodular opacities within  the right lower lobe, potentially representing scarring. No large  consolidative pulmonary opacity. No pleural effusion or  pneumothorax.  CT ABDOMEN AND PELVIS FINDINGS  Hepatobiliary: The liver is normal in size and contour. Stable  subcentimeter low-attenuation lesion inferior aspect of the right  hepatic lobe. Status post  cholecystectomy. No intrahepatic or  extrahepatic biliary ductal dilatation.  Pancreas: Grossly stable low-density mass involving the tail of the  pancreas measuring 2.7 x 2.2 cm (image 54; series 2), previously 2.6  x 2.0 cm when measured at the same location. The pancreatic head and  body are grossly unremarkable.  Spleen: Unremarkable  Adrenals/Urinary Tract: Stable thickening of the adrenal glands.  Kidneys enhance symmetrically with contrast. Stable right parapelvic  cyst. No hydronephrosis.  Stomach/Bowel: No abnormal bowel wall thickening or evidence for  bowel obstruction.  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 Scattered calcified atherosclerotic  plaque involving the abdominal aorta.  Other: Interval development of a small amount of perihepatic fluid  as well as small amount of fluid in the pelvis. Interval increase in  size and development of multiple soft tissue implants within the  anterior abdomen  compatible with peritoneal carcinomatosis. A  conglomerate of soft tissue within the left anterior hemiabdomen  measures 7.4 x 1.5 cm (image 82; series 2). Additional implants are  demonstrated throughout the abdomen and into left upper quadrant.  Musculoskeletal: No aggressive or acute appearing osseous lesions.  IMPRESSION:  Interval progression of peritoneal carcinomatosis with increasing  soft tissue implants and nodularity.  Grossly stable size of low-density mass involving the pancreatic  tail. 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   Impression and Plan:   58 year old woman with the following issues:   1. 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 She is S/P palliative chemotherapy in the form of Gemzar and Abraxane.  CT scan from 04/17/2014  showed a dramatic response to systemic chemotherapy with very little residual cancer. She did well to recently and a followup CT scan on 07/05/2014 which was reviewed today show progression of disease. Given these new findings, she resumed systemic chemotherapy with Gemzar and Abraxane. Chemotherapy is given on day 1, day 8 of a 21 day cycle. She will continue with labs and chemotherapy as scheduled.  2. Abdominal/epigastric pain: She is currently utilizing oxycodone with good relief.   3. IV access: She has a Port-A-Cath in place.this will be utilized for systemic chemotherapy.  4. Nausea prophylaxis: She has antiemetics prescribed. I have reviewed the instructions for taking her Compazine and she voiced understanding.   5.Thrombosis: Of the left lower extremity. She is currently on Lovenox and plan to continue for the time being.   6. Hyperglycemia: Blood sugar have been on the better control lately  7. Followup: 07/31/2014 for Chemotherapy and evaluation.   Wynetta Emery,  Angeldejesus Callaham E, PA-C  10/13/20155:26 PM

## 2014-07-17 NOTE — Patient Instructions (Signed)
[facility] Discharge Instructions for Patients Receiving Chemotherapy  Today you received the following chemotherapy agents Abraxane and  Gemzar  To help prevent nausea and vomiting after your treatment, we encourage you to take your nausea medication Compazine  As directed   If you develop nausea and vomiting that is not controlled by your nausea medication, call the clinic.   BELOW ARE SYMPTOMS THAT SHOULD BE REPORTED IMMEDIATELY:  *FEVER GREATER THAN 100.5 F  *CHILLS WITH OR WITHOUT FEVER  NAUSEA AND VOMITING THAT IS NOT CONTROLLED WITH YOUR NAUSEA MEDICATION  *UNUSUAL SHORTNESS OF BREATH  *UNUSUAL BRUISING OR BLEEDING  TENDERNESS IN MOUTH AND THROAT WITH OR WITHOUT PRESENCE OF ULCERS  *URINARY PROBLEMS  *BOWEL PROBLEMS  UNUSUAL RASH Items with * indicate a potential emergency and should be followed up as soon as possible.  Feel free to call the clinic you have any questions or concerns. The clinic phone number is [phone].

## 2014-07-17 NOTE — Patient Instructions (Signed)

## 2014-07-17 NOTE — Telephone Encounter (Signed)
No POF sent for visit 07/17/14 per pt is to follow up as expected.Avs Printed for pt with next apts.-Ambe

## 2014-07-17 NOTE — Progress Notes (Signed)
Platlets  @ 99 on todays labs.  Verbal consent given by  Awilda Metro, PA to treat with low platlets

## 2014-07-18 LAB — CANCER ANTIGEN 19-9: CA 19-9: 9496.5 U/mL — ABNORMAL HIGH (ref <35.0)

## 2014-07-20 MED ORDER — PROCHLORPERAZINE MALEATE 10 MG PO TABS: ORAL_TABLET | ORAL | Status: AC

## 2014-07-20 NOTE — Patient Instructions (Signed)
Continue labs and chemotherapy as scheduled Follow up with Dr. Alen Blew 07/31/2014

## 2014-07-24 ENCOUNTER — Encounter: Payer: Self-pay | Admitting: *Deleted

## 2014-07-24 NOTE — Progress Notes (Signed)
RECEIVED A FAX FROM [facility] CONCERNING A PRIOR AUTHORIZATION FOR ENOXAPARIN. THIS REQUEST WAS PLACED IN THE MANAGED CARE BIN.

## 2014-07-25 ENCOUNTER — Encounter: Payer: Self-pay | Admitting: Oncology

## 2014-07-25 NOTE — Progress Notes (Signed)
MD does not want patient to resume lovenox

## 2014-07-31 ENCOUNTER — Ambulatory Visit (HOSPITAL_COMMUNITY): Payer: PRIVATE HEALTH INSURANCE

## 2014-07-31 ENCOUNTER — Ambulatory Visit (HOSPITAL_BASED_OUTPATIENT_CLINIC_OR_DEPARTMENT_OTHER): Payer: PRIVATE HEALTH INSURANCE

## 2014-07-31 ENCOUNTER — Telehealth: Payer: Self-pay | Admitting: Oncology

## 2014-07-31 ENCOUNTER — Telehealth: Payer: Self-pay | Admitting: *Deleted

## 2014-07-31 ENCOUNTER — Other Ambulatory Visit (HOSPITAL_BASED_OUTPATIENT_CLINIC_OR_DEPARTMENT_OTHER): Payer: PRIVATE HEALTH INSURANCE

## 2014-07-31 ENCOUNTER — Ambulatory Visit (HOSPITAL_BASED_OUTPATIENT_CLINIC_OR_DEPARTMENT_OTHER): Payer: PRIVATE HEALTH INSURANCE | Admitting: Oncology

## 2014-07-31 ENCOUNTER — Ambulatory Visit: Payer: PRIVATE HEALTH INSURANCE

## 2014-07-31 VITALS — BP 112/72 | HR 84 | Temp 98.2°F | Resp 18 | Ht 65.0 in | Wt 137.9 lb

## 2014-07-31 DIAGNOSIS — Z95828 Presence of other vascular implants and grafts: Secondary | ICD-10-CM

## 2014-07-31 DIAGNOSIS — C786 Secondary malignant neoplasm of retroperitoneum and peritoneum: Secondary | ICD-10-CM

## 2014-07-31 DIAGNOSIS — Z5111 Encounter for antineoplastic chemotherapy: Secondary | ICD-10-CM

## 2014-07-31 DIAGNOSIS — C252 Malignant neoplasm of tail of pancreas: Secondary | ICD-10-CM

## 2014-07-31 DIAGNOSIS — C250 Malignant neoplasm of head of pancreas: Secondary | ICD-10-CM

## 2014-07-31 DIAGNOSIS — C787 Secondary malignant neoplasm of liver and intrahepatic bile duct: Secondary | ICD-10-CM

## 2014-07-31 DIAGNOSIS — D709 Neutropenia, unspecified: Secondary | ICD-10-CM

## 2014-07-31 DIAGNOSIS — R739 Hyperglycemia, unspecified: Secondary | ICD-10-CM

## 2014-07-31 DIAGNOSIS — C259 Malignant neoplasm of pancreas, unspecified: Secondary | ICD-10-CM

## 2014-07-31 DIAGNOSIS — R1013 Epigastric pain: Secondary | ICD-10-CM

## 2014-07-31 DIAGNOSIS — I82402 Acute embolism and thrombosis of unspecified deep veins of left lower extremity: Secondary | ICD-10-CM

## 2014-07-31 LAB — CBC WITH DIFFERENTIAL/PLATELET
BASO%: 0.3 % (ref 0.0–2.0)
BASOS ABS: 0 10*3/uL (ref 0.0–0.1)
EOS%: 1.2 % (ref 0.0–7.0)
Eosinophils Absolute: 0 10*3/uL (ref 0.0–0.5)
HCT: 34.2 % — ABNORMAL LOW (ref 34.8–46.6)
HGB: 11.2 g/dL — ABNORMAL LOW (ref 11.6–15.9)
LYMPH%: 33 % (ref 14.0–49.7)
MCH: 30.1 pg (ref 25.1–34.0)
MCHC: 32.7 g/dL (ref 31.5–36.0)
MCV: 92.1 fL (ref 79.5–101.0)
MONO#: 0.6 10*3/uL (ref 0.1–0.9)
MONO%: 20 % — ABNORMAL HIGH (ref 0.0–14.0)
NEUT#: 1.3 10*3/uL — ABNORMAL LOW (ref 1.5–6.5)
NEUT%: 45.5 % (ref 38.4–76.8)
PLATELETS: 198 10*3/uL (ref 145–400)
RBC: 3.72 10*6/uL (ref 3.70–5.45)
RDW: 15 % — ABNORMAL HIGH (ref 11.2–14.5)
WBC: 2.9 10*3/uL — ABNORMAL LOW (ref 3.9–10.3)
lymph#: 1 10*3/uL (ref 0.9–3.3)

## 2014-07-31 LAB — COMPREHENSIVE METABOLIC PANEL (CC13)
ALT: 33 U/L (ref 0–55)
ANION GAP: 11 meq/L (ref 3–11)
AST: 21 U/L (ref 5–34)
Albumin: 2.9 g/dL — ABNORMAL LOW (ref 3.5–5.0)
Alkaline Phosphatase: 111 U/L (ref 40–150)
BILIRUBIN TOTAL: 0.41 mg/dL (ref 0.20–1.20)
BUN: 6.9 mg/dL — ABNORMAL LOW (ref 7.0–26.0)
CO2: 27 meq/L (ref 22–29)
Calcium: 8.8 mg/dL (ref 8.4–10.4)
Chloride: 104 mEq/L (ref 98–109)
Creatinine: 0.6 mg/dL (ref 0.6–1.1)
Glucose: 171 mg/dl — ABNORMAL HIGH (ref 70–140)
Potassium: 3.5 mEq/L (ref 3.5–5.1)
Sodium: 141 mEq/L (ref 136–145)
Total Protein: 5.7 g/dL — ABNORMAL LOW (ref 6.4–8.3)

## 2014-07-31 LAB — CANCER ANTIGEN 19-9: CA 19-9: 12051.9 U/mL — ABNORMAL HIGH (ref <35.0)

## 2014-07-31 MED ORDER — DEXAMETHASONE SODIUM PHOSPHATE 10 MG/ML IJ SOLN
10.0000 mg | Freq: Once | INTRAMUSCULAR | Status: AC
  Administered 2014-07-31: 10 mg via INTRAVENOUS

## 2014-07-31 MED ORDER — SODIUM CHLORIDE 0.9 % IJ SOLN
10.0000 mL | INTRAMUSCULAR | Status: DC | PRN
  Administered 2014-07-31: 10 mL
  Filled 2014-07-31: qty 10

## 2014-07-31 MED ORDER — ONDANSETRON 8 MG/50ML IVPB (CHCC)
8.0000 mg | Freq: Once | INTRAVENOUS | Status: AC
  Administered 2014-07-31: 8 mg via INTRAVENOUS

## 2014-07-31 MED ORDER — GEMCITABINE HCL CHEMO INJECTION 1 GM/26.3ML
1000.0000 mg/m2 | Freq: Once | INTRAVENOUS | Status: AC
  Administered 2014-07-31: 1862 mg via INTRAVENOUS
  Filled 2014-07-31: qty 48.97

## 2014-07-31 MED ORDER — PACLITAXEL PROTEIN-BOUND CHEMO INJECTION 100 MG
100.0000 mg/m2 | Freq: Once | INTRAVENOUS | Status: AC
  Administered 2014-07-31: 175 mg via INTRAVENOUS
  Filled 2014-07-31: qty 35

## 2014-07-31 MED ORDER — DEXAMETHASONE SODIUM PHOSPHATE 10 MG/ML IJ SOLN
INTRAMUSCULAR | Status: AC
  Filled 2014-07-31: qty 1

## 2014-07-31 MED ORDER — ONDANSETRON 8 MG/NS 50 ML IVPB
INTRAVENOUS | Status: AC
  Filled 2014-07-31: qty 8

## 2014-07-31 MED ORDER — SODIUM CHLORIDE 0.9 % IV SOLN
Freq: Once | INTRAVENOUS | Status: AC
Start: 2014-07-31 — End: 2014-07-31
  Administered 2014-07-31: 10:00:00 via INTRAVENOUS

## 2014-07-31 MED ORDER — SODIUM CHLORIDE 0.9 % IJ SOLN
10.0000 mL | INTRAMUSCULAR | Status: DC | PRN
  Administered 2014-07-31: 10 mL via INTRAVENOUS
  Filled 2014-07-31: qty 10

## 2014-07-31 MED ORDER — HEPARIN SOD (PORK) LOCK FLUSH 100 UNIT/ML IV SOLN
500.0000 [IU] | Freq: Once | INTRAVENOUS | Status: AC | PRN
  Administered 2014-07-31: 500 [IU]
  Filled 2014-07-31: qty 5

## 2014-07-31 NOTE — Telephone Encounter (Signed)
Per staff message and POF I have scheduled appts. Advised scheduler of appts. JMW  

## 2014-07-31 NOTE — Patient Instructions (Signed)
[facility] Discharge Instructions for Patients Receiving Chemotherapy  Today you received the following chemotherapy agents Abraxane/Gemcitabine.   To help prevent nausea and vomiting after your treatment, we encourage you to take your nausea medication as directed.    If you develop nausea and vomiting that is not controlled by your nausea medication, call the clinic.   BELOW ARE SYMPTOMS THAT SHOULD BE REPORTED IMMEDIATELY:  *FEVER GREATER THAN 100.5 F  *CHILLS WITH OR WITHOUT FEVER  NAUSEA AND VOMITING THAT IS NOT CONTROLLED WITH YOUR NAUSEA MEDICATION  *UNUSUAL SHORTNESS OF BREATH  *UNUSUAL BRUISING OR BLEEDING  TENDERNESS IN MOUTH AND THROAT WITH OR WITHOUT PRESENCE OF ULCERS  *URINARY PROBLEMS  *BOWEL PROBLEMS  UNUSUAL RASH Items with * indicate a potential emergency and should be followed up as soon as possible.  Feel free to call the clinic you have any questions or concerns. The clinic phone number is [phone].    

## 2014-07-31 NOTE — Telephone Encounter (Signed)
gave avs & cal for Nov.

## 2014-07-31 NOTE — Patient Instructions (Signed)

## 2014-07-31 NOTE — Progress Notes (Signed)
Hematology and Oncology Follow Up Visit  Christine Gould [id number] 07-11-1956 58 y.o. 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 This was diagnosed in February of 2015. Her CA 19-9 was 37,431.  Prior Therapy: She is status post endoscopic ultrasound and a biopsy completed on 11/07/2013 confirmed the presence of adenocarcinoma.  Current therapy: She is receiving palliative chemotherapy in the form of Gemzar and Abraxane given on day 1, day 8 of 21 day cycle. Status post 7 cycles completed on 04/03/2014. She resumed this same treatment on 07/10/2014. She is here for day 1 of her next cycle of chemotherapy.  Secondary diagnosis: She developed left deep vein thrombosis on 12/12/2013 and has been on Lovenox since.  Interim History:  Christine Gould presents today for a followup visit with her husband. She tolerated chemotherapy relatively well . 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 She has not reported any further neurological deficits or worsening of her neuropathy. She continues to ambulate without any major difficulty. She does not have any falls or syncope. She still has a reasonable quality of life. She denies hematochezia or melena.She denied any decline in her performance status. She denied any alteration of mental status with confusion. She had not reported any syncope or any other complications. She has not reported any dizziness or seizure activity. Did not report any lymphadenopathy or petechiae. Did not report any skin rashes or lesions.  She denied any skeletal complaints or pains. Has not reported any mood disturbance. Remainder of her review of systems unremarkable.  Medications: I have reviewed the  patient's current medications.  Current Outpatient Prescriptions  Medication Sig Dispense Refill  . acetaminophen (TYLENOL) 500 MG tablet Take 1,000 mg by mouth every 6 (six) hours as needed for mild pain or moderate pain.      Marland Kitchen ALPRAZolam (XANAX) 0.5 MG tablet Take 0.5 mg by mouth 3 (three) times daily as needed.       Marland Kitchen amLODipine (NORVASC) 10 MG tablet Take 10 mg by mouth every morning.      . citalopram (CELEXA) 20 MG tablet Take 40 mg by mouth every morning.       Marland Kitchen ibuprofen (ADVIL,MOTRIN) 200 MG tablet Take 400 mg by mouth every 6 (six) hours as needed for mild pain or moderate pain.      Marland Kitchen lidocaine-prilocaine (EMLA) cream Apply 1 application topically daily as needed (port).      . metFORMIN (GLUCOPHAGE) 1000 MG tablet Take 1,000 mg by mouth 2 (two) times daily with a meal.      . oxyCODONE (OXY IR/ROXICODONE) 5 MG immediate release tablet Take 1 tablet (5 mg total) by mouth every 4 (four) hours as needed for severe pain.  30 tablet  0  . PRESCRIPTION MEDICATION Chemotherapy regimen      . prochlorperazine (COMPAZINE) 10 MG tablet TAKE 1 TABLET BY MOUTH EVERY 6 HOURS AS NEEDED FOR NAUSEA OR VOMITING  30 tablet  1  . traZODone (DESYREL) 100 MG tablet Take 100 mg by mouth at bedtime.       No current facility-administered medications for this visit.     Allergies:  Allergies  Allergen Reactions  . Statins Other (See Comments)    Cramps-fatigue  . Toprol Xl [Metoprolol] Other (See  Comments)    Pain, cramps, fatigue     Past Medical History, Surgical history, Social history, and Family History were reviewed and updated.   Physical Exam: Blood pressure 112/72, pulse 84, temperature 98.2 F (36.8 C), temperature source Oral, resp. rate 18, height 5\' 5"  (1.651 m), weight 137 lb 14.4 oz (62.551 kg), SpO2 98.00%.  ECOG: 1 General appearance: alert awake not in any distress. Head: Normocephalic, without obvious abnormality Neck: no adenopathy Lymph nodes: Cervical,  supraclavicular, and axillary nodes normal. Heart:regular rate and rhythm, S1, S2. Lung:chest clear, no wheezing, no dullness to percussion to Abdomin: No ascites or shifting dullness. Tender on deep palpation. Good Bbowel sounds. EXT: 1+ bilaterally. Neurological examination: No deficits noted on today's exam  Lab Results: Lab Results  Component Value Date   WBC 2.9* 07/31/2014   HGB 11.2* 07/31/2014   HCT 34.2* 07/31/2014   MCV 92.1 07/31/2014   PLT 198 07/31/2014     Chemistry      Component Value Date/Time   NA 139 07/17/2014 1359   NA 135* 04/08/2014 1130   K 3.4* 07/17/2014 1359   K 3.4* 04/08/2014 1130   CL 96 04/08/2014 1130   CO2 24 07/17/2014 1359   CO2 23 04/08/2014 1130   BUN 5.0* 07/17/2014 1359   BUN 14 04/08/2014 1130   CREATININE 0.7 07/17/2014 1359   CREATININE 0.52 04/08/2014 1130      Component Value Date/Time   CALCIUM 9.8 07/17/2014 1359   CALCIUM 8.7 04/08/2014 1130   ALKPHOS 177* 07/17/2014 1359   ALKPHOS 82 04/08/2014 1130   AST 87* 07/17/2014 1359   AST 33 04/08/2014 1130   ALT 82* 07/17/2014 1359   ALT 58* 04/08/2014 1130   BILITOT 0.53 07/17/2014 1359   BILITOT 0.3 04/08/2014 1130       Impression and Plan:   58 year old woman with the following issues:   1. 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 She is S/P palliative chemotherapy in the form of Gemzar and Abraxane.  CT scan from 04/17/2014  showed a dramatic response to systemic chemotherapy with very little residual cancer. She did well to recently and a followup CT scan on 07/05/2014  Showed  progression of disease. Given these new findings, she resumed systemic chemotherapy with Gemzar and Abraxane. Chemotherapy is given on day 1, day 8 of a 21 day cycle.  She is ready to proceed with her next cycle of chemotherapy day 1 today and day 8 will be next week.  2. Abdominal/epigastric pain:  She is currently utilizing oxycodone with good relief.   3. IV access: She has a Port-A-Cath in place.this will be utilized for systemic chemotherapy.  4. Nausea prophylaxis: She has antiemetics prescribed.   5.Thrombosis: Of the left lower extremity. She is currently on Lovenox and plan to continue for the time being.   6. Hyperglycemia: Blood sugar have been on the better control lately.  7. Neutropenia: Her ANC is adequate today but she might not be able to take day 8.  8. Followup: 08/07/2014 for Chemotherapy and evaluation.   [facility], MD 10/27/20159:41 AM

## 2014-07-31 NOTE — Progress Notes (Signed)
Ok to treat with ANC: 1.3 per MD

## 2014-07-31 NOTE — Progress Notes (Signed)
Patient provided office copy of a DO NOT RESUSCITATE ORDER to be placed in her chart. Copy given to HIM.  Patient also requesting lab and CT results to be faxed to Dr. Justin Mend. Fax sent.

## 2014-08-02 ENCOUNTER — Ambulatory Visit: Payer: PRIVATE HEALTH INSURANCE | Admitting: Oncology

## 2014-08-07 ENCOUNTER — Ambulatory Visit (HOSPITAL_BASED_OUTPATIENT_CLINIC_OR_DEPARTMENT_OTHER): Payer: PRIVATE HEALTH INSURANCE | Admitting: Oncology

## 2014-08-07 ENCOUNTER — Ambulatory Visit: Payer: PRIVATE HEALTH INSURANCE

## 2014-08-07 ENCOUNTER — Ambulatory Visit (HOSPITAL_BASED_OUTPATIENT_CLINIC_OR_DEPARTMENT_OTHER): Payer: PRIVATE HEALTH INSURANCE

## 2014-08-07 ENCOUNTER — Other Ambulatory Visit: Payer: PRIVATE HEALTH INSURANCE

## 2014-08-07 ENCOUNTER — Other Ambulatory Visit (HOSPITAL_BASED_OUTPATIENT_CLINIC_OR_DEPARTMENT_OTHER): Payer: PRIVATE HEALTH INSURANCE

## 2014-08-07 VITALS — BP 115/70 | HR 82 | Temp 98.6°F | Resp 18 | Ht 65.0 in | Wt 137.4 lb

## 2014-08-07 DIAGNOSIS — C786 Secondary malignant neoplasm of retroperitoneum and peritoneum: Secondary | ICD-10-CM

## 2014-08-07 DIAGNOSIS — C252 Malignant neoplasm of tail of pancreas: Secondary | ICD-10-CM

## 2014-08-07 DIAGNOSIS — N390 Urinary tract infection, site not specified: Secondary | ICD-10-CM

## 2014-08-07 DIAGNOSIS — I82502 Chronic embolism and thrombosis of unspecified deep veins of left lower extremity: Secondary | ICD-10-CM

## 2014-08-07 DIAGNOSIS — Z5111 Encounter for antineoplastic chemotherapy: Secondary | ICD-10-CM

## 2014-08-07 DIAGNOSIS — C259 Malignant neoplasm of pancreas, unspecified: Secondary | ICD-10-CM

## 2014-08-07 DIAGNOSIS — Z95828 Presence of other vascular implants and grafts: Secondary | ICD-10-CM

## 2014-08-07 DIAGNOSIS — C787 Secondary malignant neoplasm of liver and intrahepatic bile duct: Secondary | ICD-10-CM

## 2014-08-07 LAB — CBC WITH DIFFERENTIAL/PLATELET
BASO%: 0.4 % (ref 0.0–2.0)
Basophils Absolute: 0 10*3/uL (ref 0.0–0.1)
EOS%: 0 % (ref 0.0–7.0)
Eosinophils Absolute: 0 10*3/uL (ref 0.0–0.5)
HEMATOCRIT: 29 % — AB (ref 34.8–46.6)
HGB: 10.1 g/dL — ABNORMAL LOW (ref 11.6–15.9)
LYMPH%: 25.4 % (ref 14.0–49.7)
MCH: 30.4 pg (ref 25.1–34.0)
MCHC: 34.8 g/dL (ref 31.5–36.0)
MCV: 87.3 fL (ref 79.5–101.0)
MONO#: 0.8 10*3/uL (ref 0.1–0.9)
MONO%: 16.4 % — ABNORMAL HIGH (ref 0.0–14.0)
NEUT#: 2.8 10*3/uL (ref 1.5–6.5)
NEUT%: 57.8 % (ref 38.4–76.8)
PLATELETS: 104 10*3/uL — AB (ref 145–400)
RBC: 3.32 10*6/uL — ABNORMAL LOW (ref 3.70–5.45)
RDW: 15 % — ABNORMAL HIGH (ref 11.2–14.5)
WBC: 4.9 10*3/uL (ref 3.9–10.3)
lymph#: 1.2 10*3/uL (ref 0.9–3.3)

## 2014-08-07 LAB — COMPREHENSIVE METABOLIC PANEL (CC13)
ALT: 38 U/L (ref 0–55)
ANION GAP: 12 meq/L — AB (ref 3–11)
AST: 42 U/L — AB (ref 5–34)
Albumin: 2.8 g/dL — ABNORMAL LOW (ref 3.5–5.0)
Alkaline Phosphatase: 122 U/L (ref 40–150)
BUN: 3 mg/dL — AB (ref 7.0–26.0)
CALCIUM: 8.8 mg/dL (ref 8.4–10.4)
CO2: 24 meq/L (ref 22–29)
CREATININE: 0.7 mg/dL (ref 0.6–1.1)
Chloride: 102 mEq/L (ref 98–109)
Glucose: 228 mg/dl — ABNORMAL HIGH (ref 70–140)
Potassium: 3.1 mEq/L — ABNORMAL LOW (ref 3.5–5.1)
Sodium: 138 mEq/L (ref 136–145)
Total Bilirubin: 0.7 mg/dL (ref 0.20–1.20)
Total Protein: 5.9 g/dL — ABNORMAL LOW (ref 6.4–8.3)

## 2014-08-07 MED ORDER — SODIUM CHLORIDE 0.9 % IJ SOLN
10.0000 mL | INTRAMUSCULAR | Status: DC | PRN
  Administered 2014-08-07: 10 mL via INTRAVENOUS
  Filled 2014-08-07: qty 10

## 2014-08-07 MED ORDER — CIPROFLOXACIN HCL 500 MG PO TABS: ORAL_TABLET | ORAL | Status: DC

## 2014-08-07 MED ORDER — ONDANSETRON 8 MG/50ML IVPB (CHCC)
8.0000 mg | Freq: Once | INTRAVENOUS | Status: AC
  Administered 2014-08-07: 8 mg via INTRAVENOUS

## 2014-08-07 MED ORDER — HEPARIN SOD (PORK) LOCK FLUSH 100 UNIT/ML IV SOLN
500.0000 [IU] | Freq: Once | INTRAVENOUS | Status: AC | PRN
  Administered 2014-08-07: 500 [IU]
  Filled 2014-08-07: qty 5

## 2014-08-07 MED ORDER — DEXAMETHASONE SODIUM PHOSPHATE 10 MG/ML IJ SOLN
10.0000 mg | Freq: Once | INTRAMUSCULAR | Status: AC
  Administered 2014-08-07: 10 mg via INTRAVENOUS

## 2014-08-07 MED ORDER — OXYCODONE HCL 5 MG PO TABS: 5.0000 mg | ORAL_TABLET | ORAL | Status: DC | PRN

## 2014-08-07 MED ORDER — ONDANSETRON 8 MG/NS 50 ML IVPB
INTRAVENOUS | Status: AC
  Filled 2014-08-07: qty 8

## 2014-08-07 MED ORDER — SODIUM CHLORIDE 0.9 % IV SOLN
1000.0000 mg/m2 | Freq: Once | INTRAVENOUS | Status: AC
  Administered 2014-08-07: 1862 mg via INTRAVENOUS
  Filled 2014-08-07: qty 48.97

## 2014-08-07 MED ORDER — PACLITAXEL PROTEIN-BOUND CHEMO INJECTION 100 MG
100.0000 mg/m2 | Freq: Once | INTRAVENOUS | Status: AC
  Administered 2014-08-07: 175 mg via INTRAVENOUS
  Filled 2014-08-07: qty 35

## 2014-08-07 MED ORDER — DEXAMETHASONE SODIUM PHOSPHATE 10 MG/ML IJ SOLN
INTRAMUSCULAR | Status: AC
  Filled 2014-08-07: qty 1

## 2014-08-07 MED ORDER — SODIUM CHLORIDE 0.9 % IV SOLN
Freq: Once | INTRAVENOUS | Status: AC
  Administered 2014-08-07: 14:00:00 via INTRAVENOUS

## 2014-08-07 MED ORDER — SODIUM CHLORIDE 0.9 % IJ SOLN
10.0000 mL | INTRAMUSCULAR | Status: DC | PRN
  Administered 2014-08-07: 10 mL
  Filled 2014-08-07: qty 10

## 2014-08-07 NOTE — Patient Instructions (Signed)
[facility] Discharge Instructions for Patients Receiving Chemotherapy  Today you received the following chemotherapy agents Abraxane/Gemzar.   To help prevent nausea and vomiting after your treatment, we encourage you to take your nausea medication as directed.    If you develop nausea and vomiting that is not controlled by your nausea medication, call the clinic.   BELOW ARE SYMPTOMS THAT SHOULD BE REPORTED IMMEDIATELY:  *FEVER GREATER THAN 100.5 F  *CHILLS WITH OR WITHOUT FEVER  NAUSEA AND VOMITING THAT IS NOT CONTROLLED WITH YOUR NAUSEA MEDICATION  *UNUSUAL SHORTNESS OF BREATH  *UNUSUAL BRUISING OR BLEEDING  TENDERNESS IN MOUTH AND THROAT WITH OR WITHOUT PRESENCE OF ULCERS  *URINARY PROBLEMS  *BOWEL PROBLEMS  UNUSUAL RASH Items with * indicate a potential emergency and should be followed up as soon as possible.  Feel free to call the clinic you have any questions or concerns. The clinic phone number is [phone].    

## 2014-08-07 NOTE — Patient Instructions (Signed)

## 2014-08-07 NOTE — Progress Notes (Signed)
Hematology and Oncology Follow Up Visit  Christine Gould [id number] 1956/03/05 58 y.o. 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 This was diagnosed in February of 2015. Her CA 19-9 was 37,431.  Prior Therapy: She is status post endoscopic ultrasound and a biopsy completed on 11/07/2013 confirmed the presence of adenocarcinoma.  Current therapy: She is receiving palliative chemotherapy in the form of Gemzar and Abraxane given on day 1, day 8 of 21 day cycle. Status post 7 cycles completed on 04/03/2014. She resumed this same treatment on 07/10/2014. She is here for day 1 of her next cycle of chemotherapy.  Secondary diagnosis: She developed left deep vein thrombosis on 12/12/2013 and has been on Lovenox since.  Interim History:  Christine Gould presents today for a followup visit with her husband. She tolerated chemotherapy last week without any complications. She continues to have her baseline level of neuropathy which has not progressed. She is reporting urinary symptoms of dysuria and frequency. She continues to ambulate without any major difficulty. She does not have any falls or syncope. She still has a reasonable quality of life. She denies hematochezia or melena.She denied any decline in her performance status. She denied any alteration of mental status with confusion. She had not reported any syncope or any other complications. She has not reported any dizziness or seizure activity. Did not report any lymphadenopathy or petechiae. Did not report any skin rashes or lesions.  She denied any skeletal complaints or pains. Has not reported any mood disturbance. Her pain is under reasonable control for the time being. Remainder of her review of systems unremarkable.  Medications: I have reviewed the patient's  current medications.  Current Outpatient Prescriptions  Medication Sig Dispense Refill  . acetaminophen (TYLENOL) 500 MG tablet Take 1,000 mg by mouth every 6 (six) hours as needed for mild pain or moderate pain.    Marland Kitchen ALPRAZolam (XANAX) 0.5 MG tablet Take 0.5 mg by mouth 3 (three) times daily as needed.     Marland Kitchen amLODipine (NORVASC) 10 MG tablet Take 10 mg by mouth every morning.    . ciprofloxacin (CIPRO) 500 MG tablet Take one tablet twice a day for 10 days. 20 tablet 1  . citalopram (CELEXA) 20 MG tablet Take 40 mg by mouth every morning.     Marland Kitchen ibuprofen (ADVIL,MOTRIN) 200 MG tablet Take 400 mg by mouth every 6 (six) hours as needed for mild pain or moderate pain.    Marland Kitchen lidocaine-prilocaine (EMLA) cream Apply 1 application topically daily as needed (port).    . metFORMIN (GLUCOPHAGE) 1000 MG tablet Take 1,000 mg by mouth 2 (two) times daily with a meal.    . oxyCODONE (OXY IR/ROXICODONE) 5 MG immediate release tablet Take 1 tablet (5 mg total) by mouth every 4 (four) hours as needed for severe pain. 30 tablet 0  . PRESCRIPTION MEDICATION Chemotherapy regimen    . prochlorperazine (COMPAZINE) 10 MG tablet TAKE 1 TABLET BY MOUTH EVERY 6 HOURS AS NEEDED FOR NAUSEA OR VOMITING 30 tablet 1  . traZODone (DESYREL) 100 MG tablet Take 100 mg by mouth at bedtime.     No current facility-administered medications for this visit.     Allergies:  Allergies  Allergen Reactions  . Statins Other (See Comments)    Cramps-fatigue  . Toprol Xl [Metoprolol] Other (See Comments)  Pain, cramps, fatigue     Past Medical History, Surgical history, Social history, and Family History were reviewed and updated.   Physical Exam: Blood pressure 115/70, pulse 82, temperature 98.6 F (37 C), temperature source Oral, resp. rate 18, height 5\' 5"  (1.651 m), weight 137 lb 6.4 oz (62.324 kg), SpO2 99 %.  ECOG: 1 General appearance: alert awake not in any distress. Head: Normocephalic, without obvious  abnormality Neck: no adenopathy Lymph nodes: Cervical, supraclavicular, and axillary nodes normal. Heart:regular rate and rhythm, S1, S2. Lung:chest clear, no wheezing, no dullness to percussion to Abdomin: No ascites or shifting dullness. Tender on deep palpation. Good Bbowel sounds. EXT: 1+ bilaterally. Neurological examination: No deficits noted on today's exam  Lab Results: Lab Results  Component Value Date   WBC 4.9 08/07/2014   HGB 10.1* 08/07/2014   HCT 29.0* 08/07/2014   MCV 87.3 08/07/2014   PLT 104* 08/07/2014     Chemistry      Component Value Date/Time   NA 138 08/07/2014 1209   NA 135* 04/08/2014 1130   K 3.1* 08/07/2014 1209   K 3.4* 04/08/2014 1130   CL 96 04/08/2014 1130   CO2 24 08/07/2014 1209   CO2 23 04/08/2014 1130   BUN 3.0* 08/07/2014 1209   BUN 14 04/08/2014 1130   CREATININE 0.7 08/07/2014 1209   CREATININE 0.52 04/08/2014 1130      Component Value Date/Time   CALCIUM 8.8 08/07/2014 1209   CALCIUM 8.7 04/08/2014 1130   ALKPHOS 122 08/07/2014 1209   ALKPHOS 82 04/08/2014 1130   AST 42* 08/07/2014 1209   AST 33 04/08/2014 1130   ALT 38 08/07/2014 1209   ALT 58* 04/08/2014 1130   BILITOT 0.70 08/07/2014 1209   BILITOT 0.3 04/08/2014 1130     Results for Christine Gould (MRN [id number]) as of 08/07/2014 12:39  Ref. Range 03/27/2014 09:30 07/17/2014 13:58 07/31/2014 09:11  CA 19-9 Latest Range: <35.0 U/mL 185.4 (H) 9496.5 (H) 12051.9 (H)    Impression and Plan:   58 year old woman with the following issues:   1. 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 She is S/P palliative chemotherapy in the form of Gemzar and Abraxane.  CT scan from 04/17/2014  showed a dramatic response to systemic chemotherapy with very little residual cancer.  Followup CT scan on 07/05/2014  Showed  progression of disease. Given these new findings, she  resumed systemic chemotherapy with Gemzar and Abraxane.  Chemotherapy is given on day 1, day 8 of a 21 day cycle.   She is ready to proceed with day 8 of her current cycle of chemotherapy.  2. Abdominal/epigastric pain: She is currently utilizing oxycodone with good relief. She uses it 2 or 3 times a week.  3. IV access: She has a Port-A-Cath in place.this will be utilized for systemic chemotherapy.  4. Nausea prophylaxis: She has antiemetics prescribed.   5.Thrombosis: Of the left lower extremity. She is currently on Lovenox and plan to continue for the time being.   6. UTI: Associated with chemotherapy usually. I gave her prescription for ciprofloxacin.  7. Neutropenia: Her ANC is adequate today.  8. Followup: 08/21/2014 for Chemotherapy and evaluation.   Zola Button, MD 11/3/201512:54 PM

## 2014-08-21 ENCOUNTER — Encounter: Payer: Self-pay | Admitting: Physician Assistant

## 2014-08-21 ENCOUNTER — Ambulatory Visit: Payer: PRIVATE HEALTH INSURANCE

## 2014-08-21 ENCOUNTER — Other Ambulatory Visit: Payer: Self-pay

## 2014-08-21 ENCOUNTER — Other Ambulatory Visit (HOSPITAL_BASED_OUTPATIENT_CLINIC_OR_DEPARTMENT_OTHER): Payer: PRIVATE HEALTH INSURANCE

## 2014-08-21 ENCOUNTER — Telehealth: Payer: Self-pay | Admitting: Physician Assistant

## 2014-08-21 ENCOUNTER — Telehealth: Payer: Self-pay | Admitting: *Deleted

## 2014-08-21 ENCOUNTER — Ambulatory Visit (HOSPITAL_BASED_OUTPATIENT_CLINIC_OR_DEPARTMENT_OTHER): Payer: PRIVATE HEALTH INSURANCE | Admitting: Physician Assistant

## 2014-08-21 ENCOUNTER — Encounter: Payer: PRIVATE HEALTH INSURANCE | Admitting: Nutrition

## 2014-08-21 VITALS — BP 124/78 | HR 76 | Temp 98.6°F | Resp 18 | Ht 65.0 in | Wt 137.3 lb

## 2014-08-21 DIAGNOSIS — E876 Hypokalemia: Secondary | ICD-10-CM

## 2014-08-21 DIAGNOSIS — C252 Malignant neoplasm of tail of pancreas: Secondary | ICD-10-CM

## 2014-08-21 DIAGNOSIS — Z9889 Other specified postprocedural states: Secondary | ICD-10-CM

## 2014-08-21 DIAGNOSIS — C250 Malignant neoplasm of head of pancreas: Secondary | ICD-10-CM

## 2014-08-21 DIAGNOSIS — Z95828 Presence of other vascular implants and grafts: Secondary | ICD-10-CM

## 2014-08-21 LAB — CBC WITH DIFFERENTIAL/PLATELET
BASO%: 0.2 % (ref 0.0–2.0)
Basophils Absolute: 0 10*3/uL (ref 0.0–0.1)
EOS%: 3.1 % (ref 0.0–7.0)
Eosinophils Absolute: 0.1 10*3/uL (ref 0.0–0.5)
HCT: 30.4 % — ABNORMAL LOW (ref 34.8–46.6)
HGB: 9.9 g/dL — ABNORMAL LOW (ref 11.6–15.9)
LYMPH%: 40.7 % (ref 14.0–49.7)
MCH: 31 pg (ref 25.1–34.0)
MCHC: 32.7 g/dL (ref 31.5–36.0)
MCV: 95 fL (ref 79.5–101.0)
MONO#: 0.7 10*3/uL (ref 0.1–0.9)
MONO%: 21.7 % — ABNORMAL HIGH (ref 0.0–14.0)
NEUT#: 1 10*3/uL — ABNORMAL LOW (ref 1.5–6.5)
NEUT%: 34.3 % — ABNORMAL LOW (ref 38.4–76.8)
PLATELETS: 146 10*3/uL (ref 145–400)
RBC: 3.2 10*6/uL — AB (ref 3.70–5.45)
RDW: 20.9 % — AB (ref 11.2–14.5)
WBC: 3 10*3/uL — AB (ref 3.9–10.3)
lymph#: 1.2 10*3/uL (ref 0.9–3.3)

## 2014-08-21 LAB — COMPREHENSIVE METABOLIC PANEL (CC13)
ALBUMIN: 2.8 g/dL — AB (ref 3.5–5.0)
ALT: 25 U/L (ref 0–55)
ANION GAP: 10 meq/L (ref 3–11)
AST: 24 U/L (ref 5–34)
Alkaline Phosphatase: 125 U/L (ref 40–150)
BUN: 4.5 mg/dL — ABNORMAL LOW (ref 7.0–26.0)
CO2: 27 meq/L (ref 22–29)
Calcium: 8.2 mg/dL — ABNORMAL LOW (ref 8.4–10.4)
Chloride: 102 mEq/L (ref 98–109)
Creatinine: 0.6 mg/dL (ref 0.6–1.1)
GLUCOSE: 263 mg/dL — AB (ref 70–140)
SODIUM: 140 meq/L (ref 136–145)
TOTAL PROTEIN: 5.6 g/dL — AB (ref 6.4–8.3)
Total Bilirubin: 0.57 mg/dL (ref 0.20–1.20)

## 2014-08-21 LAB — MAGNESIUM (CC13): Magnesium: 1.4 mg/dl — CL (ref 1.5–2.5)

## 2014-08-21 LAB — TECHNOLOGIST REVIEW

## 2014-08-21 MED ORDER — SODIUM CHLORIDE 0.9 % IJ SOLN
10.0000 mL | INTRAMUSCULAR | Status: DC | PRN
  Administered 2014-08-21: 10 mL via INTRAVENOUS
  Filled 2014-08-21: qty 10

## 2014-08-21 MED ORDER — MAGNESIUM OXIDE 400 (241.3 MG) MG PO TABS: 400.0000 mg | ORAL_TABLET | Freq: Two times a day (BID) | ORAL | Status: AC

## 2014-08-21 MED ORDER — HEPARIN SOD (PORK) LOCK FLUSH 100 UNIT/ML IV SOLN
500.0000 [IU] | Freq: Once | INTRAVENOUS | Status: AC
Start: 2014-08-21 — End: 2014-08-21
  Administered 2014-08-21: 500 [IU] via INTRAVENOUS
  Filled 2014-08-21: qty 5

## 2014-08-21 MED ORDER — POTASSIUM CHLORIDE CRYS ER 20 MEQ PO TBCR: 40.0000 meq | EXTENDED_RELEASE_TABLET | Freq: Once | ORAL | Status: AC

## 2014-08-21 NOTE — Telephone Encounter (Signed)
Per staff message and POF I have scheduled appts. Advised scheduler of appts and to move labs. JMW  

## 2014-08-21 NOTE — Telephone Encounter (Signed)
Gave avs & cal for Nov/Dec. Sent mess to sch tx °

## 2014-08-21 NOTE — Progress Notes (Signed)
Hematology and Oncology Follow Up Visit  Christine Gould [id number] December 19, 1955 58 y.o. 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 This was diagnosed in February of 2015. Her CA 19-9 was 37,431.  Prior Therapy: She is status post endoscopic ultrasound and a biopsy completed on 11/07/2013 confirmed the presence of adenocarcinoma.  Current therapy: She is receiving palliative chemotherapy in the form of Gemzar and Abraxane given on day 1, day 8 of 21 day cycle. Status post 7 cycles completed on 04/03/2014. She resumed this same treatment on 07/10/2014. She is here for day 1 of her next cycle of chemotherapy.  Secondary diagnosis: She developed left deep vein thrombosis on 12/12/2013 and has been on Lovenox since.  Interim History:  Christine Gould presents today for a followup visit with her son. She tolerated chemotherapy last week without any complications. She continues to have her baseline level of neuropathy which has not progressed. She continues to ambulate without any major difficulty. She denies falls or syncope. She still has a reasonable quality of life. She denies hematochezia or melena.She denied any decline in her performance status. She denied any alteration of mental status with confusion. She denies any other complications. She has not reported any dizziness or seizure activity. Did not report any lymphadenopathy or petechiae. Did not report any skin rashes or lesions.  She denied any skeletal complaints or pains. Has not reported any mood disturbance. Her pain continues to be under reasonable control. fRemainder of her review of systems unremarkable.  Medications: I have reviewed the patient's current medications.  Current Outpatient Prescriptions  Medication Sig Dispense Refill  . acetaminophen  (TYLENOL) 500 MG tablet Take 1,000 mg by mouth every 6 (six) hours as needed for mild pain or moderate pain.    Marland Kitchen ALPRAZolam (XANAX) 0.5 MG tablet Take 0.5 mg by mouth 3 (three) times daily as needed.     Marland Kitchen amLODipine (NORVASC) 10 MG tablet Take 10 mg by mouth every morning.    . citalopram (CELEXA) 40 MG tablet   3  . ibuprofen (ADVIL,MOTRIN) 200 MG tablet Take 400 mg by mouth every 6 (six) hours as needed for mild pain or moderate pain.    Marland Kitchen lidocaine-prilocaine (EMLA) cream Apply 1 application topically daily as needed (port).    . metFORMIN (GLUCOPHAGE) 1000 MG tablet Take 1,000 mg by mouth 2 (two) times daily with a meal.    . oxyCODONE (OXY IR/ROXICODONE) 5 MG immediate release tablet Take 1 tablet (5 mg total) by mouth every 4 (four) hours as needed for severe pain. 30 tablet 0  . PRESCRIPTION MEDICATION Chemotherapy regimen    . prochlorperazine (COMPAZINE) 10 MG tablet TAKE 1 TABLET BY MOUTH EVERY 6 HOURS AS NEEDED FOR NAUSEA OR VOMITING 30 tablet 1  . traZODone (DESYREL) 100 MG tablet Take 100 mg by mouth at bedtime.    . magnesium oxide (MAG-OX) 400 (241.3 MG) MG tablet Take 1 tablet (400 mg total) by mouth 2 (two) times daily. 30 tablet 0  . potassium chloride SA (K-DUR,KLOR-CON) 20 MEQ tablet Take 2 tablets (40 mEq total) by mouth once. 18 tablet 0   No current facility-administered medications for this visit.     Allergies:  Allergies  Allergen Reactions  . Statins Other (See Comments)    Cramps-fatigue  . Toprol Xl [Metoprolol] Other (See Comments)  Pain, cramps, fatigue     Past Medical History, Surgical history, Social history, and Family History were reviewed and updated.   Physical Exam: Blood pressure 124/78, pulse 76, temperature 98.6 F (37 C), temperature source Oral, resp. rate 18, height 5\' 5"  (1.651 m), weight 137 lb 4.8 oz (62.279 kg), SpO2 99 %.  ECOG: 1 General appearance: alert awake not in any distress. Head: Normocephalic, without obvious  abnormality Neck: no adenopathy Lymph nodes: Cervical, supraclavicular, and axillary nodes normal. Heart:regular rate and rhythm, S1, S2. Lung:chest clear, no wheezing, no dullness to percussion to Abdomin: No ascites or shifting dullness. No tenderness on deep palpation. Good bowel sounds. EXT: 1+ bilaterally. Neurological examination: No deficits noted on today's exam  Lab Results: Lab Results  Component Value Date   WBC 3.0* 08/21/2014   HGB 9.9* 08/21/2014   HCT 30.4* 08/21/2014   MCV 95.0 08/21/2014   PLT 146 08/21/2014     Chemistry      Component Value Date/Time   NA 140 08/21/2014 1120   NA 135* 04/08/2014 1130   K 2.6 Repeated and Verified* 08/21/2014 1120   K 3.4* 04/08/2014 1130   CL 96 04/08/2014 1130   CO2 27 08/21/2014 1120   CO2 23 04/08/2014 1130   BUN 4.5* 08/21/2014 1120   BUN 14 04/08/2014 1130   CREATININE 0.6 08/21/2014 1120   CREATININE 0.52 04/08/2014 1130      Component Value Date/Time   CALCIUM 8.2* 08/21/2014 1120   CALCIUM 8.7 04/08/2014 1130   ALKPHOS 125 08/21/2014 1120   ALKPHOS 82 04/08/2014 1130   AST 24 08/21/2014 1120   AST 33 04/08/2014 1130   ALT 25 08/21/2014 1120   ALT 58* 04/08/2014 1130   BILITOT 0.57 08/21/2014 1120   BILITOT 0.3 04/08/2014 1130     Results for GENITA, NILSSON (MRN [id number]) as of 08/07/2014 12:39  Ref. Range 03/27/2014 09:30 07/17/2014 13:58 07/31/2014 09:11  CA 19-9 Latest Range: <35.0 U/mL 185.4 (H) 9496.5 (H) 12051.9 (H)    Impression and Plan:   58 year old woman with the following issues:   1. 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 She is S/P palliative chemotherapy in the form of Gemzar and Abraxane.  CT scan from 04/17/2014  showed a dramatic response to systemic chemotherapy with very little residual cancer.  Followup CT scan on 07/05/2014  Showed  progression of disease. Given  these new findings, she resumed systemic chemotherapy with Gemzar and Abraxane.  Chemotherapy is given on day 1, day 8 of a 21 day cycle.   Day one of her chemotherapy of the current cycle will be delayed by one week secondary to neutropenia with an ANC 1.0.   2. Abdominal/epigastric pain: She is currently utilizing oxycodone with good relief. She uses it 2 or 3 times a week.  3. IV access: She has a Port-A-Cath in place.this will be utilized for systemic chemotherapy.  4. Nausea prophylaxis: She has antiemetics prescribed.   5.Thrombosis: Of the left lower extremity. She is currently on Lovenox and plan to continue for the time being.   6. Hypokalemia: Potassium level is 2.6 today. She was given 40 mEq of potassium chloride by mouth and a prescription for an additional 9 days of potassium chloride 40 mEq daily with [facility].   7. Neutropenia: Her ANC is low today at 1.0. Chemotherapy will be postponed by one week.  8. Followup: 08/28/2014 for Chemotherapy and evaluation.   The patient was discussed with Dr. Julien Nordmann in Dr. Hazeline Junker absence.  Wynetta Emery, Tomia Enlow E,PA-C  11/17/20159:59 PM

## 2014-08-21 NOTE — Telephone Encounter (Signed)
Called and informed pt prescription for Magnesium Oxide was sent to her pharmacy. Pt verbalized understanding

## 2014-08-21 NOTE — Patient Instructions (Signed)

## 2014-08-22 ENCOUNTER — Telehealth: Payer: Self-pay | Admitting: Oncology

## 2014-08-22 LAB — CA 125: CA 125: 64 U/mL — AB (ref <35)

## 2014-08-22 LAB — CA 125(PREVIOUS METHOD): CA 125: 57 U/mL — ABNORMAL HIGH (ref 0.0–30.2)

## 2014-08-22 NOTE — Telephone Encounter (Signed)
Pt called states she has different apt. Advised of the changed due to chemo pt confirmed...Marland KitchenMarland KitchenMarland Kitchen KJ

## 2014-08-23 NOTE — Patient Instructions (Signed)
Your potassium level is low. Take the potassium as prescribed Your chemotherapy is being postponed by one week because your neutrophil count is too low. Follow up in 1 week

## 2014-08-28 ENCOUNTER — Ambulatory Visit (HOSPITAL_BASED_OUTPATIENT_CLINIC_OR_DEPARTMENT_OTHER): Payer: PRIVATE HEALTH INSURANCE | Admitting: Oncology

## 2014-08-28 ENCOUNTER — Telehealth: Payer: Self-pay | Admitting: Oncology

## 2014-08-28 ENCOUNTER — Other Ambulatory Visit (HOSPITAL_BASED_OUTPATIENT_CLINIC_OR_DEPARTMENT_OTHER): Payer: PRIVATE HEALTH INSURANCE

## 2014-08-28 ENCOUNTER — Ambulatory Visit (HOSPITAL_BASED_OUTPATIENT_CLINIC_OR_DEPARTMENT_OTHER): Payer: PRIVATE HEALTH INSURANCE

## 2014-08-28 ENCOUNTER — Ambulatory Visit: Payer: PRIVATE HEALTH INSURANCE | Admitting: Nutrition

## 2014-08-28 VITALS — BP 133/78 | HR 88 | Temp 98.2°F | Resp 17 | Ht 65.0 in | Wt 135.2 lb

## 2014-08-28 DIAGNOSIS — C252 Malignant neoplasm of tail of pancreas: Secondary | ICD-10-CM

## 2014-08-28 DIAGNOSIS — E876 Hypokalemia: Secondary | ICD-10-CM

## 2014-08-28 DIAGNOSIS — R109 Unspecified abdominal pain: Secondary | ICD-10-CM

## 2014-08-28 DIAGNOSIS — C259 Malignant neoplasm of pancreas, unspecified: Secondary | ICD-10-CM

## 2014-08-28 DIAGNOSIS — C250 Malignant neoplasm of head of pancreas: Secondary | ICD-10-CM

## 2014-08-28 DIAGNOSIS — Z5111 Encounter for antineoplastic chemotherapy: Secondary | ICD-10-CM

## 2014-08-28 LAB — COMPREHENSIVE METABOLIC PANEL (CC13)
ALBUMIN: 3 g/dL — AB (ref 3.5–5.0)
ALK PHOS: 170 U/L — AB (ref 40–150)
ALT: 20 U/L (ref 0–55)
AST: 26 U/L (ref 5–34)
Anion Gap: 10 mEq/L (ref 3–11)
BILIRUBIN TOTAL: 0.75 mg/dL (ref 0.20–1.20)
BUN: 4.7 mg/dL — ABNORMAL LOW (ref 7.0–26.0)
CO2: 26 mEq/L (ref 22–29)
Calcium: 9.1 mg/dL (ref 8.4–10.4)
Chloride: 99 mEq/L (ref 98–109)
Creatinine: 0.8 mg/dL (ref 0.6–1.1)
Glucose: 389 mg/dl — ABNORMAL HIGH (ref 70–140)
POTASSIUM: 4.5 meq/L (ref 3.5–5.1)
Sodium: 135 mEq/L — ABNORMAL LOW (ref 136–145)
TOTAL PROTEIN: 6.5 g/dL (ref 6.4–8.3)

## 2014-08-28 LAB — CBC WITH DIFFERENTIAL/PLATELET
BASO%: 1.4 % (ref 0.0–2.0)
Basophils Absolute: 0.1 10*3/uL (ref 0.0–0.1)
EOS%: 2 % (ref 0.0–7.0)
Eosinophils Absolute: 0.1 10*3/uL (ref 0.0–0.5)
HCT: 36.4 % (ref 34.8–46.6)
HGB: 11.6 g/dL (ref 11.6–15.9)
LYMPH%: 22.3 % (ref 14.0–49.7)
MCH: 30.6 pg (ref 25.1–34.0)
MCHC: 31.9 g/dL (ref 31.5–36.0)
MCV: 96 fL (ref 79.5–101.0)
MONO#: 0.9 10*3/uL (ref 0.1–0.9)
MONO%: 18.6 % — ABNORMAL HIGH (ref 0.0–14.0)
NEUT%: 55.7 % (ref 38.4–76.8)
NEUTROS ABS: 2.7 10*3/uL (ref 1.5–6.5)
PLATELETS: 170 10*3/uL (ref 145–400)
RBC: 3.79 10*6/uL (ref 3.70–5.45)
RDW: 20.5 % — AB (ref 11.2–14.5)
WBC: 4.9 10*3/uL (ref 3.9–10.3)
lymph#: 1.1 10*3/uL (ref 0.9–3.3)

## 2014-08-28 MED ORDER — PACLITAXEL PROTEIN-BOUND CHEMO INJECTION 100 MG
100.0000 mg/m2 | Freq: Once | INTRAVENOUS | Status: AC
  Administered 2014-08-28: 175 mg via INTRAVENOUS
  Filled 2014-08-28: qty 35

## 2014-08-28 MED ORDER — ONDANSETRON 8 MG/50ML IVPB (CHCC)
8.0000 mg | Freq: Once | INTRAVENOUS | Status: AC
  Administered 2014-08-28: 8 mg via INTRAVENOUS

## 2014-08-28 MED ORDER — HEPARIN SOD (PORK) LOCK FLUSH 100 UNIT/ML IV SOLN
500.0000 [IU] | Freq: Once | INTRAVENOUS | Status: AC | PRN
  Administered 2014-08-28: 500 [IU]
  Filled 2014-08-28: qty 5

## 2014-08-28 MED ORDER — DEXAMETHASONE SODIUM PHOSPHATE 10 MG/ML IJ SOLN
10.0000 mg | Freq: Once | INTRAMUSCULAR | Status: AC
  Administered 2014-08-28: 10 mg via INTRAVENOUS

## 2014-08-28 MED ORDER — ONDANSETRON 8 MG/NS 50 ML IVPB
INTRAVENOUS | Status: AC
  Filled 2014-08-28: qty 8

## 2014-08-28 MED ORDER — SODIUM CHLORIDE 0.9 % IV SOLN
Freq: Once | INTRAVENOUS | Status: AC
  Administered 2014-08-28: 10:00:00 via INTRAVENOUS

## 2014-08-28 MED ORDER — GEMCITABINE HCL CHEMO INJECTION 1 GM/26.3ML
1000.0000 mg/m2 | Freq: Once | INTRAVENOUS | Status: AC
  Administered 2014-08-28: 1862 mg via INTRAVENOUS
  Filled 2014-08-28: qty 48.97

## 2014-08-28 MED ORDER — SODIUM CHLORIDE 0.9 % IJ SOLN
10.0000 mL | INTRAMUSCULAR | Status: DC | PRN
  Administered 2014-08-28: 10 mL
  Filled 2014-08-28: qty 10

## 2014-08-28 MED ORDER — DEXAMETHASONE SODIUM PHOSPHATE 10 MG/ML IJ SOLN
INTRAMUSCULAR | Status: AC
Start: 2014-08-28 — End: 2014-08-28
  Filled 2014-08-28: qty 1

## 2014-08-28 NOTE — Telephone Encounter (Signed)
gv and printed appt sched and avs for pt for DEC...sed added tx....emailed MD and PA for visit on 12.1.15

## 2014-08-28 NOTE — Progress Notes (Signed)
Nutrition follow up completed with patient during chemotherapy. Weight documented as 135.2 pounds down from 150 pounds in May 2015. Patient reports nausea with treatment but denies vomiting. Labs reviewed: K  2.6, Glucose 263, Alb 2.8. Patient reports eating well and consuming one Ensure Plus daily.  Nutrition Diagnosis:  Unintended weight loss related to decreased oral intake as evidenced by 10% weight loss in 6 months.  Intervention: Educated patient to increase snacks throughout the day and include protein at every meal and snack. Recommended increase Ensure Plus BID. Provided coupons. Questions answered and teach back method used.  Monitoring, evaluation, Goals:  Patient will increase oral intake to minimize further weight loss.  Next Visit:  Tuesday, Dec 15, during chemotherapy.

## 2014-08-28 NOTE — Progress Notes (Signed)
Hematology and Oncology Follow Up Visit  Christine Gould [id number] [birth year] 58 y.o. 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 This was diagnosed in February of 2015. Her CA 19-9 was 37,431.  Prior Therapy: She is status post endoscopic ultrasound and a biopsy completed on 11/07/2013 confirmed the presence of adenocarcinoma.  Current therapy: She is receiving palliative chemotherapy in the form of Gemzar and Abraxane given on day 1, day 8 of 21 day cycle. Status post 7 cycles completed on 04/03/2014. She resumed this same treatment on 07/10/2014. She is here for day 1 of her next cycle of chemotherapy.  Secondary diagnosis: She developed left deep vein thrombosis on 12/12/2013 she was treated with Lovenox for 6 months.  Interim History:  Christine Gould presents today for a followup visit with her daughter. She tolerated chemotherapy last cycle without any complications. She continues to have her baseline level of neuropathy which has not progressed. She continues to ambulate without any major difficulty using a cane. She did lose 2 pounds since the last visit but for the most part appetite have been reasonable. She denies falls or syncope. She still has a reasonable quality of life. She denies hematochezia or melena.She denied any decline in her performance status. She denied any alteration of mental status with confusion. She denies any other complications. She has not reported any dizziness or seizure activity. Did not report any lymphadenopathy or petechiae. Did not report any skin rashes or lesions.  She denied any skeletal complaints or pains. Has not reported any mood disturbance. Her pain continues to be under reasonable control. Remainder of her review of systems unremarkable.  Medications: I have  reviewed the patient's current medications.  Current Outpatient Prescriptions  Medication Sig Dispense Refill  . acetaminophen (TYLENOL) 500 MG tablet Take 1,000 mg by mouth every 6 (six) hours as needed for mild pain or moderate pain.    Marland Kitchen ALPRAZolam (XANAX) 0.5 MG tablet Take 0.5 mg by mouth 3 (three) times daily as needed.     Marland Kitchen amLODipine (NORVASC) 10 MG tablet Take 10 mg by mouth every morning.    . citalopram (CELEXA) 40 MG tablet   3  . ibuprofen (ADVIL,MOTRIN) 200 MG tablet Take 400 mg by mouth every 6 (six) hours as needed for mild pain or moderate pain.    Marland Kitchen lidocaine-prilocaine (EMLA) cream Apply 1 application topically daily as needed (port).    . magnesium oxide (MAG-OX) 400 (241.3 MG) MG tablet Take 1 tablet (400 mg total) by mouth 2 (two) times daily. 30 tablet 0  . metFORMIN (GLUCOPHAGE) 1000 MG tablet Take 1,000 mg by mouth 2 (two) times daily with a meal.    . oxyCODONE (OXY IR/ROXICODONE) 5 MG immediate release tablet Take 1 tablet (5 mg total) by mouth every 4 (four) hours as needed for severe pain. 30 tablet 0  . potassium chloride SA (K-DUR,KLOR-CON) 20 MEQ tablet Take 2 tablets (40 mEq total) by mouth once. 18 tablet 0  . PRESCRIPTION MEDICATION Chemotherapy regimen    . prochlorperazine (COMPAZINE) 10 MG tablet TAKE 1 TABLET BY MOUTH EVERY 6 HOURS AS NEEDED FOR NAUSEA OR VOMITING 30 tablet 1  . traZODone (DESYREL) 100 MG tablet Take 100 mg by mouth at bedtime.     No current facility-administered medications for this visit.     Allergies:  Allergies  Allergen Reactions  . Statins Other (See Comments)    Cramps-fatigue  . Toprol Xl [Metoprolol] Other (See Comments)    Pain, cramps, fatigue     Past Medical History, Surgical history, Social history, and Family History were reviewed and updated.   Physical Exam: Blood pressure 133/78, pulse 88, temperature 98.2 F (36.8 C), temperature source Oral, resp. rate 17, height 5\' 5"  (1.651 m), weight 135 lb 3.2 oz  (61.326 kg), SpO2 100 %.  ECOG: 1 General appearance: alert awake not in any distress. Head: Normocephalic, without obvious abnormality Neck: no adenopathy Lymph nodes: Cervical, supraclavicular, and axillary nodes normal. Heart:regular rate and rhythm, S1, S2. Lung:chest clear, no wheezing, no dullness to percussion to Abdomin: No ascites or shifting dullness. No tenderness on deep palpation. Good bowel sounds. EXT: 1+ bilaterally. Neurological examination: No deficits noted on today's exam  Lab Results: Lab Results  Component Value Date   WBC 4.9 08/28/2014   HGB 11.6 08/28/2014   HCT 36.4 08/28/2014   MCV 96.0 08/28/2014   PLT 170 08/28/2014     Chemistry      Component Value Date/Time   NA 140 08/21/2014 1120   NA 135* 04/08/2014 1130   K 2.6 Repeated and Verified* 08/21/2014 1120   K 3.4* 04/08/2014 1130   CL 96 04/08/2014 1130   CO2 27 08/21/2014 1120   CO2 23 04/08/2014 1130   BUN 4.5* 08/21/2014 1120   BUN 14 04/08/2014 1130   CREATININE 0.6 08/21/2014 1120   CREATININE 0.52 04/08/2014 1130      Component Value Date/Time   CALCIUM 8.2* 08/21/2014 1120   CALCIUM 8.7 04/08/2014 1130   ALKPHOS 125 08/21/2014 1120   ALKPHOS 82 04/08/2014 1130   AST 24 08/21/2014 1120   AST 33 04/08/2014 1130   ALT 25 08/21/2014 1120   ALT 58* 04/08/2014 1130   BILITOT 0.57 08/21/2014 1120   BILITOT 0.3 04/08/2014 1130       Impression and Plan:   58 year old woman with the following issues:   1. 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 She is S/P palliative chemotherapy in the form of Gemzar and Abraxane.  CT scan from 04/17/2014  showed a dramatic response to systemic chemotherapy with very little residual cancer.  Followup CT scan on 07/05/2014  Showed  progression of disease. Given these new findings, she resumed systemic chemotherapy with Gemzar and  Abraxane.  Chemotherapy is given on day 1, day 8 of a 21 day cycle.   She is very to proceed with day 1 of the next cycle of chemotherapy. She will receive day 8 on December 1 and after that she will have a staging workup including a CT scan to assess response. If she continues to benefit clinically and radiographically, we'll continue with the current regimen.  2. Abdominal/epigastric pain: She is currently utilizing oxycodone with good relief. She uses it 2 or 3 times a week. Chemotherapy have improved her overall abdominal pain.  3. IV access: She has a Port-A-Cath in place.this will be utilized for systemic chemotherapy.  4. Nausea prophylaxis: She has antiemetics prescribed.   5.Thrombosis: Of the left lower extremity. She is off Lovenox at this time.  6. Hypokalemia: Potassium replaced last time and we will recheck it today.  7. Neutropenia: Her ANC is back to normal at this time is ready to proceed.  8. Followup: On December 1 for day 8 of the  next cycle of chemotherapy and she'll have another evaluation on 12:15 after a CT scan.    Almyra Birman,MD 11/24/20159:20 AM

## 2014-08-28 NOTE — Patient Instructions (Signed)
[facility] Discharge Instructions for Patients Receiving Chemotherapy  Today you received the following chemotherapy agents: Abraxane and Gemzar   To help prevent nausea and vomiting after your treatment, we encourage you to take your nausea medication as prescribed.    If you develop nausea and vomiting that is not controlled by your nausea medication, call the clinic.   BELOW ARE SYMPTOMS THAT SHOULD BE REPORTED IMMEDIATELY:  *FEVER GREATER THAN 100.5 F  *CHILLS WITH OR WITHOUT FEVER  NAUSEA AND VOMITING THAT IS NOT CONTROLLED WITH YOUR NAUSEA MEDICATION  *UNUSUAL SHORTNESS OF BREATH  *UNUSUAL BRUISING OR BLEEDING  TENDERNESS IN MOUTH AND THROAT WITH OR WITHOUT PRESENCE OF ULCERS  *URINARY PROBLEMS  *BOWEL PROBLEMS  UNUSUAL RASH Items with * indicate a potential emergency and should be followed up as soon as possible.  Feel free to call the clinic you have any questions or concerns. The clinic phone number is [phone].    

## 2014-08-31 ENCOUNTER — Ambulatory Visit: Payer: PRIVATE HEALTH INSURANCE | Admitting: Oncology

## 2014-09-04 ENCOUNTER — Other Ambulatory Visit (HOSPITAL_BASED_OUTPATIENT_CLINIC_OR_DEPARTMENT_OTHER): Payer: PRIVATE HEALTH INSURANCE

## 2014-09-04 ENCOUNTER — Other Ambulatory Visit: Payer: PRIVATE HEALTH INSURANCE

## 2014-09-04 ENCOUNTER — Ambulatory Visit (HOSPITAL_BASED_OUTPATIENT_CLINIC_OR_DEPARTMENT_OTHER): Payer: PRIVATE HEALTH INSURANCE

## 2014-09-04 DIAGNOSIS — C252 Malignant neoplasm of tail of pancreas: Secondary | ICD-10-CM

## 2014-09-04 DIAGNOSIS — C250 Malignant neoplasm of head of pancreas: Secondary | ICD-10-CM

## 2014-09-04 DIAGNOSIS — C786 Secondary malignant neoplasm of retroperitoneum and peritoneum: Secondary | ICD-10-CM

## 2014-09-04 DIAGNOSIS — Z5111 Encounter for antineoplastic chemotherapy: Secondary | ICD-10-CM

## 2014-09-04 DIAGNOSIS — C787 Secondary malignant neoplasm of liver and intrahepatic bile duct: Secondary | ICD-10-CM

## 2014-09-04 LAB — CBC WITH DIFFERENTIAL/PLATELET
BASO%: 0.6 % (ref 0.0–2.0)
Basophils Absolute: 0.1 10*3/uL (ref 0.0–0.1)
EOS%: 0.4 % (ref 0.0–7.0)
Eosinophils Absolute: 0 10*3/uL (ref 0.0–0.5)
HEMATOCRIT: 30.5 % — AB (ref 34.8–46.6)
HGB: 9.9 g/dL — ABNORMAL LOW (ref 11.6–15.9)
LYMPH%: 19.1 % (ref 14.0–49.7)
MCH: 30.9 pg (ref 25.1–34.0)
MCHC: 32.3 g/dL (ref 31.5–36.0)
MCV: 95.5 fL (ref 79.5–101.0)
MONO#: 1.1 10*3/uL — ABNORMAL HIGH (ref 0.1–0.9)
MONO%: 12.7 % (ref 0.0–14.0)
NEUT#: 5.8 10*3/uL (ref 1.5–6.5)
NEUT%: 67.2 % (ref 38.4–76.8)
Platelets: 107 10*3/uL — ABNORMAL LOW (ref 145–400)
RBC: 3.19 10*6/uL — AB (ref 3.70–5.45)
RDW: 19 % — ABNORMAL HIGH (ref 11.2–14.5)
WBC: 8.7 10*3/uL (ref 3.9–10.3)
lymph#: 1.7 10*3/uL (ref 0.9–3.3)

## 2014-09-04 LAB — COMPREHENSIVE METABOLIC PANEL (CC13)
ALT: 32 U/L (ref 0–55)
AST: 39 U/L — ABNORMAL HIGH (ref 5–34)
Albumin: 3 g/dL — ABNORMAL LOW (ref 3.5–5.0)
Alkaline Phosphatase: 167 U/L — ABNORMAL HIGH (ref 40–150)
Anion Gap: 15 mEq/L — ABNORMAL HIGH (ref 3–11)
BILIRUBIN TOTAL: 0.66 mg/dL (ref 0.20–1.20)
BUN: 6 mg/dL — ABNORMAL LOW (ref 7.0–26.0)
CO2: 22 mEq/L (ref 22–29)
CREATININE: 0.7 mg/dL (ref 0.6–1.1)
Calcium: 9.3 mg/dL (ref 8.4–10.4)
Chloride: 98 mEq/L (ref 98–109)
Glucose: 307 mg/dl — ABNORMAL HIGH (ref 70–140)
Potassium: 3.5 mEq/L (ref 3.5–5.1)
SODIUM: 135 meq/L — AB (ref 136–145)
TOTAL PROTEIN: 6.3 g/dL — AB (ref 6.4–8.3)

## 2014-09-04 LAB — TECHNOLOGIST REVIEW

## 2014-09-04 MED ORDER — ONDANSETRON 8 MG/NS 50 ML IVPB
INTRAVENOUS | Status: AC
  Filled 2014-09-04: qty 8

## 2014-09-04 MED ORDER — ONDANSETRON 8 MG/50ML IVPB (CHCC)
8.0000 mg | Freq: Once | INTRAVENOUS | Status: AC
  Administered 2014-09-04: 8 mg via INTRAVENOUS

## 2014-09-04 MED ORDER — DEXAMETHASONE SODIUM PHOSPHATE 10 MG/ML IJ SOLN
INTRAMUSCULAR | Status: AC
  Filled 2014-09-04: qty 1

## 2014-09-04 MED ORDER — HEPARIN SOD (PORK) LOCK FLUSH 100 UNIT/ML IV SOLN
500.0000 [IU] | Freq: Once | INTRAVENOUS | Status: AC | PRN
  Administered 2014-09-04: 500 [IU]
  Filled 2014-09-04: qty 5

## 2014-09-04 MED ORDER — SODIUM CHLORIDE 0.9 % IV SOLN
Freq: Once | INTRAVENOUS | Status: AC
  Administered 2014-09-04: 15:00:00 via INTRAVENOUS

## 2014-09-04 MED ORDER — SODIUM CHLORIDE 0.9 % IV SOLN
1000.0000 mg/m2 | Freq: Once | INTRAVENOUS | Status: AC
  Administered 2014-09-04: 1862 mg via INTRAVENOUS
  Filled 2014-09-04: qty 48.97

## 2014-09-04 MED ORDER — DEXAMETHASONE SODIUM PHOSPHATE 10 MG/ML IJ SOLN
10.0000 mg | Freq: Once | INTRAMUSCULAR | Status: AC
  Administered 2014-09-04: 10 mg via INTRAVENOUS

## 2014-09-04 MED ORDER — PACLITAXEL PROTEIN-BOUND CHEMO INJECTION 100 MG
100.0000 mg/m2 | Freq: Once | INTRAVENOUS | Status: AC
  Administered 2014-09-04: 175 mg via INTRAVENOUS
  Filled 2014-09-04: qty 35

## 2014-09-04 MED ORDER — SODIUM CHLORIDE 0.9 % IJ SOLN
10.0000 mL | INTRAMUSCULAR | Status: DC | PRN
  Administered 2014-09-04: 10 mL
  Filled 2014-09-04: qty 10

## 2014-09-04 NOTE — Patient Instructions (Signed)
[facility] Discharge Instructions for Patients Receiving Chemotherapy  Today you received the following chemotherapy agents: Abraxane and Gemzar   To help prevent nausea and vomiting after your treatment, we encourage you to take your nausea medication as prescribed.    If you develop nausea and vomiting that is not controlled by your nausea medication, call the clinic.   BELOW ARE SYMPTOMS THAT SHOULD BE REPORTED IMMEDIATELY:  *FEVER GREATER THAN 100.5 F  *CHILLS WITH OR WITHOUT FEVER  NAUSEA AND VOMITING THAT IS NOT CONTROLLED WITH YOUR NAUSEA MEDICATION  *UNUSUAL SHORTNESS OF BREATH  *UNUSUAL BRUISING OR BLEEDING  TENDERNESS IN MOUTH AND THROAT WITH OR WITHOUT PRESENCE OF ULCERS  *URINARY PROBLEMS  *BOWEL PROBLEMS  UNUSUAL RASH Items with * indicate a potential emergency and should be followed up as soon as possible.  Feel free to call the clinic you have any questions or concerns. The clinic phone number is [phone].    

## 2014-09-11 ENCOUNTER — Telehealth: Payer: Self-pay | Admitting: Medical Oncology

## 2014-09-11 NOTE — Telephone Encounter (Signed)
Patient called asking for advise and possible prescription for "horrific hemorrhoids," states has been using preporation H. Patient confirms that she does have a history of hemorrhoids. Recommended patient to call her PCP to have them evaluated for treatment. Patient gave verbal understanding. No further questions at this time.

## 2014-09-17 ENCOUNTER — Ambulatory Visit (HOSPITAL_COMMUNITY)
Admission: RE | Admit: 2014-09-17 | Discharge: 2014-09-17 | Disposition: A | Discharge status: Home / Self Care | Payer: PRIVATE HEALTH INSURANCE | Source: Ambulatory Visit | Attending: Oncology | Admitting: Oncology

## 2014-09-17 ENCOUNTER — Other Ambulatory Visit (HOSPITAL_BASED_OUTPATIENT_CLINIC_OR_DEPARTMENT_OTHER): Payer: PRIVATE HEALTH INSURANCE

## 2014-09-17 DIAGNOSIS — C259 Malignant neoplasm of pancreas, unspecified: Secondary | ICD-10-CM | POA: Diagnosis not present

## 2014-09-17 DIAGNOSIS — C786 Secondary malignant neoplasm of retroperitoneum and peritoneum: Secondary | ICD-10-CM

## 2014-09-17 DIAGNOSIS — R110 Nausea: Secondary | ICD-10-CM | POA: Insufficient documentation

## 2014-09-17 DIAGNOSIS — C787 Secondary malignant neoplasm of liver and intrahepatic bile duct: Secondary | ICD-10-CM

## 2014-09-17 DIAGNOSIS — C252 Malignant neoplasm of tail of pancreas: Secondary | ICD-10-CM

## 2014-09-17 DIAGNOSIS — Z79899 Other long term (current) drug therapy: Secondary | ICD-10-CM | POA: Diagnosis not present

## 2014-09-17 LAB — CBC WITH DIFFERENTIAL/PLATELET
BASO%: 0.5 % (ref 0.0–2.0)
BASOS ABS: 0 10*3/uL (ref 0.0–0.1)
EOS%: 4.3 % (ref 0.0–7.0)
Eosinophils Absolute: 0.1 10*3/uL (ref 0.0–0.5)
HCT: 31.2 % — ABNORMAL LOW (ref 34.8–46.6)
HEMOGLOBIN: 10.1 g/dL — AB (ref 11.6–15.9)
LYMPH%: 42.1 % (ref 14.0–49.7)
MCH: 31.7 pg (ref 25.1–34.0)
MCHC: 32.4 g/dL (ref 31.5–36.0)
MCV: 97.8 fL (ref 79.5–101.0)
MONO#: 0.5 10*3/uL (ref 0.1–0.9)
MONO%: 22 % — AB (ref 0.0–14.0)
NEUT#: 0.7 10*3/uL — ABNORMAL LOW (ref 1.5–6.5)
NEUT%: 31.1 % — ABNORMAL LOW (ref 38.4–76.8)
Platelets: 104 10*3/uL — ABNORMAL LOW (ref 145–400)
RBC: 3.19 10*6/uL — AB (ref 3.70–5.45)
RDW: 21.2 % — ABNORMAL HIGH (ref 11.2–14.5)
WBC: 2.1 10*3/uL — ABNORMAL LOW (ref 3.9–10.3)
lymph#: 0.9 10*3/uL (ref 0.9–3.3)

## 2014-09-17 LAB — COMPREHENSIVE METABOLIC PANEL (CC13)
ALBUMIN: 2.8 g/dL — AB (ref 3.5–5.0)
ALK PHOS: 126 U/L (ref 40–150)
ALT: 31 U/L (ref 0–55)
AST: 23 U/L (ref 5–34)
Anion Gap: 14 mEq/L — ABNORMAL HIGH (ref 3–11)
BUN: 5.4 mg/dL — AB (ref 7.0–26.0)
CO2: 24 mEq/L (ref 22–29)
CREATININE: 0.7 mg/dL (ref 0.6–1.1)
Calcium: 8.4 mg/dL (ref 8.4–10.4)
Chloride: 101 mEq/L (ref 98–109)
EGFR: >90 mL/min/{1.73_m2} (ref >90)
Glucose: 306 mg/dl — ABNORMAL HIGH (ref 70–140)
POTASSIUM: 3.4 meq/L — AB (ref 3.5–5.1)
Sodium: 139 mEq/L (ref 136–145)
Total Bilirubin: 0.45 mg/dL (ref 0.20–1.20)
Total Protein: 5.6 g/dL — ABNORMAL LOW (ref 6.4–8.3)

## 2014-09-17 LAB — CANCER ANTIGEN 19-9: CA 19-9: 4258.4 U/mL — ABNORMAL HIGH (ref <35.0)

## 2014-09-17 MED ORDER — IOHEXOL 300 MG/ML  SOLN
100.0000 mL | Freq: Once | INTRAMUSCULAR | Status: AC | PRN
  Administered 2014-09-17: 100 mL via INTRAVENOUS

## 2014-09-18 ENCOUNTER — Encounter: Payer: PRIVATE HEALTH INSURANCE | Admitting: Nutrition

## 2014-09-18 ENCOUNTER — Ambulatory Visit: Payer: PRIVATE HEALTH INSURANCE

## 2014-09-18 ENCOUNTER — Ambulatory Visit (HOSPITAL_BASED_OUTPATIENT_CLINIC_OR_DEPARTMENT_OTHER): Payer: PRIVATE HEALTH INSURANCE | Admitting: Oncology

## 2014-09-18 ENCOUNTER — Telehealth: Payer: Self-pay | Admitting: Oncology

## 2014-09-18 VITALS — BP 111/70 | HR 80 | Temp 98.5°F | Resp 18 | Ht 65.0 in | Wt 138.8 lb

## 2014-09-18 DIAGNOSIS — R109 Unspecified abdominal pain: Secondary | ICD-10-CM

## 2014-09-18 DIAGNOSIS — K5900 Constipation, unspecified: Secondary | ICD-10-CM

## 2014-09-18 DIAGNOSIS — C786 Secondary malignant neoplasm of retroperitoneum and peritoneum: Secondary | ICD-10-CM

## 2014-09-18 DIAGNOSIS — I82402 Acute embolism and thrombosis of unspecified deep veins of left lower extremity: Secondary | ICD-10-CM

## 2014-09-18 DIAGNOSIS — C787 Secondary malignant neoplasm of liver and intrahepatic bile duct: Secondary | ICD-10-CM

## 2014-09-18 DIAGNOSIS — C259 Malignant neoplasm of pancreas, unspecified: Secondary | ICD-10-CM

## 2014-09-18 DIAGNOSIS — D709 Neutropenia, unspecified: Secondary | ICD-10-CM

## 2014-09-18 DIAGNOSIS — C252 Malignant neoplasm of tail of pancreas: Secondary | ICD-10-CM

## 2014-09-18 NOTE — Progress Notes (Signed)
Hematology and Oncology Follow Up Visit  Christine Gould [id number] Mar 01, 1956 58 y.o. 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 This was diagnosed in February of 2015. Her CA 19-9 was 37,431.  Prior Therapy: She is status post endoscopic ultrasound and a biopsy completed on 11/07/2013 confirmed the presence of adenocarcinoma.  Current therapy: She is receiving palliative chemotherapy in the form of Gemzar and Abraxane given on day 1, day 8 of 21 day cycle. Status post 7 cycles completed on 04/03/2014. She resumed this same treatment on 07/10/2014.   Secondary diagnosis: She developed left deep vein thrombosis on 12/12/2013 she was treated with Lovenox for 6 months.  Interim History:  Christine Gould presents today for a followup visit with her husband. She tolerated chemotherapy last cycle without any complications. She did develop severe constipation that required laxatives and cause some hemorrhoidal pain. This seems to be resolving at this time. She continues to have her baseline level of neuropathy which has not progressed. She continues to ambulate without any major difficulty using a cane. She did gain 1 pound since the last visit but for the most part appetite have been reasonable. She denies falls or syncope. She still has a reasonable quality of life. She denies hematochezia or melena.She denied any decline in her performance status. She denied any alteration of mental status with confusion. She denies any other complications. She has not reported any dizziness or seizure activity. Did not report any lymphadenopathy or petechiae. Did not report any skin rashes or lesions.  She denied any skeletal complaints or pains. Has not reported any mood disturbance. Her pain continues to be under reasonable control.  Remainder of her review of systems unremarkable.  Medications: I have reviewed the patient's current medications.  Current Outpatient Prescriptions  Medication Sig Dispense Refill  . acetaminophen (TYLENOL) 500 MG tablet Take 1,000 mg by mouth every 6 (six) hours as needed for mild pain or moderate pain.    Marland Kitchen ALPRAZolam (XANAX) 0.5 MG tablet Take 0.5 mg by mouth 3 (three) times daily as needed.     Marland Kitchen amLODipine (NORVASC) 10 MG tablet Take 10 mg by mouth every morning.    . citalopram (CELEXA) 40 MG tablet   3  . ibuprofen (ADVIL,MOTRIN) 200 MG tablet Take 400 mg by mouth every 6 (six) hours as needed for mild pain or moderate pain.    Marland Kitchen lidocaine-prilocaine (EMLA) cream Apply 1 application topically daily as needed (port).    . magnesium oxide (MAG-OX) 400 (241.3 MG) MG tablet Take 1 tablet (400 mg total) by mouth 2 (two) times daily. 30 tablet 0  . metFORMIN (GLUCOPHAGE) 1000 MG tablet Take 1,000 mg by mouth 2 (two) times daily with a meal.    . oxyCODONE (OXY IR/ROXICODONE) 5 MG immediate release tablet Take 1 tablet (5 mg total) by mouth every 4 (four) hours as needed for severe pain. 30 tablet 0  . potassium chloride SA (K-DUR,KLOR-CON) 20 MEQ tablet Take 2 tablets (40 mEq total) by mouth once. 18 tablet 0  . PRESCRIPTION MEDICATION Chemotherapy regimen    . prochlorperazine (COMPAZINE) 10 MG tablet TAKE 1 TABLET BY MOUTH EVERY 6 HOURS AS NEEDED FOR NAUSEA OR VOMITING 30 tablet 1  . traZODone (DESYREL) 100 MG tablet Take 100 mg by mouth at bedtime.     No current facility-administered medications  for this visit.     Allergies:  Allergies  Allergen Reactions  . Statins Other (See Comments)    Cramps-fatigue  . Toprol Xl [Metoprolol] Other (See Comments)    Pain, cramps, fatigue     Past Medical History, Surgical history, Social history, and Family History were reviewed and updated.   Physical Exam: Blood pressure 111/70, pulse 80, temperature 98.5 F (36.9 C), temperature  source Oral, resp. rate 18, height 5\' 5"  (1.651 m), weight 138 lb 12.8 oz (62.959 kg), SpO2 100 %.  ECOG: 1 General appearance: alert awake not in any distress. Head: Normocephalic, without obvious abnormality Neck: no adenopathy Lymph nodes: Cervical, supraclavicular, and axillary nodes normal. Heart:regular rate and rhythm, S1, S2. Lung:chest clear, no wheezing, no dullness to percussion to Abdomin: No ascites or shifting dullness. No tenderness on deep palpation. Good bowel sounds. EXT: 1+ bilaterally. Neurological examination: No deficits noted on today's exam  Lab Results: Lab Results  Component Value Date   WBC 2.1* 09/17/2014   HGB 10.1* 09/17/2014   HCT 31.2* 09/17/2014   MCV 97.8 09/17/2014   PLT 104* 09/17/2014     Chemistry      Component Value Date/Time   NA 139 09/17/2014 0834   NA 135* 04/08/2014 1130   K 3.4* 09/17/2014 0834   K 3.4* 04/08/2014 1130   CL 96 04/08/2014 1130   CO2 24 09/17/2014 0834   CO2 23 04/08/2014 1130   BUN 5.4* 09/17/2014 0834   BUN 14 04/08/2014 1130   CREATININE 0.7 09/17/2014 0834   CREATININE 0.52 04/08/2014 1130      Component Value Date/Time   CALCIUM 8.4 09/17/2014 0834   CALCIUM 8.7 04/08/2014 1130   ALKPHOS 126 09/17/2014 0834   ALKPHOS 82 04/08/2014 1130   AST 23 09/17/2014 0834   AST 33 04/08/2014 1130   ALT 31 09/17/2014 0834   ALT 58* 04/08/2014 1130   BILITOT 0.45 09/17/2014 0834   BILITOT 0.3 04/08/2014 1130     Results for Christine, Gould (MRN [id number]) as of 09/18/2014 09:11  Ref. Range 07/31/2014 09:11 08/21/2014 11:20 08/21/2014 11:20 09/17/2014 08:34  CA 19-9 Latest Range: <35.0 U/mL 12051.9 (H)   4258.4 (H)    EXAM: CT CHEST, ABDOMEN, AND PELVIS WITH CONTRAST  TECHNIQUE: Multidetector CT imaging of the chest, abdomen and pelvis was performed following the standard protocol during bolus administration of intravenous contrast.  CONTRAST: 121mL OMNIPAQUE IOHEXOL 300 MG/ML SOLN  COMPARISON:  07/05/2014.  FINDINGS: CT CHEST FINDINGS  Right IJ Port-A-Cath terminates at the SVC RA junction. No pathologically enlarged mediastinal, hilar or axillary lymph nodes. Coronary artery calcification. Heart size within normal limits. No pericardial effusion.  Mild subpleural peribronchovascular nodularity in the right lower lobe, unchanged and likely post infectious or postinflammatory in etiology. Lungs are otherwise clear. No pleural fluid. Airway is unremarkable.  CT ABDOMEN AND PELVIS FINDINGS  Hepatobiliary: Liver margin is irregular. Low-attenuation lesion in the inferior right hepatic lobe measures 8 mm, stable. Very mild intrahepatic biliary ductal dilatation is unchanged. Cholecystectomy. Extrahepatic bile duct is normal in caliber.  Pancreas: Pancreatic tail mass is somewhat ill-defined, measuring approximately 2.1 x 2.8 cm (series 2, image 40), stable. Mild surrounding haziness. Pancreas is otherwise unremarkable.  Spleen: Negative.  Adrenals/Urinary Tract: Adrenal glands are unremarkable. Renal cortical scarring bilaterally. Low-attenuation lesions in the kidneys measure up to 2.0 cm in the right renal sinus, likely cysts. Kidneys are otherwise unremarkable. Ureters are decompressed. Bladder appears thick-walled but is somewhat under distended.  Stomach/Bowel: Stomach is grossly unremarkable. Proximal duodenum appears slightly thick walled. Small bowel and colon are otherwise grossly unremarkable.  Vascular/Lymphatic: Atherosclerotic calcification of the arterial vasculature without abdominal aortic aneurysm. Splenic vein occlusion, as before, with associated varices. Additional varices in the upper abdomen, surrounding the stomach, gastroesophageal junction and distal esophagus. Recanalized paraumbilical vein. Retroperitoneal lymph nodes are not enlarged by CT size criteria.  Reproductive: Uterus and ovaries are visualized.  Other: Omental  nodularity has improved somewhat in the interval. A previously measured conglomerate collection of lymph nodes in the left lower quadrant has decreased in size. Largest individual nodule measures 0.8 x 1.3 cm (series 6, image 123), previously 1.4 x 2.0 cm. Small to moderate ascites, increased.  Musculoskeletal: No worrisome lytic or sclerotic lesions. Degenerative changes are seen in the spine.  IMPRESSION: 1. Pancreatic tail mass is stable. 2. Omental nodularity has improved slightly in the interval. 3. Small to moderate ascites, increased. 4. Cirrhosis with prominent varices. 5. Bladder appears thick-walled but is suboptimally distended.   Impression and Plan:   58 year old woman with the following issues:   1. 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 She is S/P palliative chemotherapy in the form of Gemzar and Abraxane.  CT scan from 04/17/2014  showed a dramatic response to systemic chemotherapy with very little residual cancer.  Followup CT scan on 07/05/2014  Showed  progression of disease. Given these new findings, she resumed systemic chemotherapy with Gemzar and Abraxane.  Chemotherapy is given on day 1, day 8 of a 21 day cycle.   CT scan results on 09/17/2014 was discussed today and showed overall response to systemic chemotherapy. The plan is to resume chemotherapy after one-week delay related to neutropenia.   2. Abdominal/epigastric pain: She is currently utilizing oxycodone with good relief. She uses it 2 or 3 times a week. Chemotherapy have improved her overall abdominal pain.  3. IV access: She has a Port-A-Cath in place.this will be utilized for systemic chemotherapy.  4. Nausea prophylaxis: She has antiemetics prescribed.   5.Thrombosis: Of the left lower extremity. She is off Lovenox at this time.  6. Constipation: Likely related to systemic  chemotherapy as well as narcotic analgesics. Educated about using laxatives on a daily basis.  7. Neutropenia: Her ANC is low and she will be delayed one week of therapy.  8. Followup: In one week for the start of next cycle of chemotherapy.    Shivam Mestas,MD 12/15/20159:17 AM

## 2014-09-18 NOTE — Telephone Encounter (Signed)
Pt confirmed labs/ov per 12/15 POF, gave pt AVS.... KJ, sent msg to add chemo  °

## 2014-09-25 ENCOUNTER — Encounter (HOSPITAL_BASED_OUTPATIENT_CLINIC_OR_DEPARTMENT_OTHER): Payer: PRIVATE HEALTH INSURANCE | Admitting: Oncology

## 2014-09-25 ENCOUNTER — Ambulatory Visit (HOSPITAL_BASED_OUTPATIENT_CLINIC_OR_DEPARTMENT_OTHER): Payer: PRIVATE HEALTH INSURANCE

## 2014-09-25 ENCOUNTER — Other Ambulatory Visit: Payer: Self-pay | Admitting: *Deleted

## 2014-09-25 ENCOUNTER — Ambulatory Visit: Payer: PRIVATE HEALTH INSURANCE | Admitting: Nutrition

## 2014-09-25 DIAGNOSIS — Z5111 Encounter for antineoplastic chemotherapy: Secondary | ICD-10-CM

## 2014-09-25 DIAGNOSIS — C252 Malignant neoplasm of tail of pancreas: Secondary | ICD-10-CM

## 2014-09-25 DIAGNOSIS — C259 Malignant neoplasm of pancreas, unspecified: Secondary | ICD-10-CM

## 2014-09-25 DIAGNOSIS — C250 Malignant neoplasm of head of pancreas: Secondary | ICD-10-CM

## 2014-09-25 LAB — COMPREHENSIVE METABOLIC PANEL (CC13)
ALBUMIN: 2.8 g/dL — AB (ref 3.5–5.0)
ALT: 18 U/L (ref 0–55)
ANION GAP: 11 meq/L (ref 3–11)
AST: 21 U/L (ref 5–34)
Alkaline Phosphatase: 151 U/L — ABNORMAL HIGH (ref 40–150)
BUN: 7 mg/dL (ref 7.0–26.0)
CALCIUM: 8.8 mg/dL (ref 8.4–10.4)
CO2: 28 meq/L (ref 22–29)
CREATININE: 0.7 mg/dL (ref 0.6–1.1)
Chloride: 101 mEq/L (ref 98–109)
EGFR: >90 mL/min/{1.73_m2} (ref >90)
Glucose: 299 mg/dl — ABNORMAL HIGH (ref 70–140)
POTASSIUM: 3.5 meq/L (ref 3.5–5.1)
Sodium: 139 mEq/L (ref 136–145)
Total Bilirubin: 0.59 mg/dL (ref 0.20–1.20)
Total Protein: 6 g/dL — ABNORMAL LOW (ref 6.4–8.3)

## 2014-09-25 LAB — CBC WITH DIFFERENTIAL/PLATELET
BASO%: 1.2 % (ref 0.0–2.0)
BASOS ABS: 0 10*3/uL (ref 0.0–0.1)
EOS%: 1 % (ref 0.0–7.0)
Eosinophils Absolute: 0 10*3/uL (ref 0.0–0.5)
HEMATOCRIT: 34.2 % — AB (ref 34.8–46.6)
HEMOGLOBIN: 12 g/dL (ref 11.6–15.9)
LYMPH%: 25.4 % (ref 14.0–49.7)
MCH: 36 pg — ABNORMAL HIGH (ref 25.1–34.0)
MCHC: 35 g/dL (ref 31.5–36.0)
MCV: 102.7 fL — ABNORMAL HIGH (ref 79.5–101.0)
MONO#: 0.6 10*3/uL (ref 0.1–0.9)
MONO%: 16.8 % — ABNORMAL HIGH (ref 0.0–14.0)
NEUT#: 2.1 10*3/uL (ref 1.5–6.5)
NEUT%: 55.6 % (ref 38.4–76.8)
Platelets: 155 10*3/uL (ref 145–400)
RBC: 3.33 10*6/uL — ABNORMAL LOW (ref 3.70–5.45)
RDW: 20.5 % — AB (ref 11.2–14.5)
WBC: 3.8 10*3/uL — ABNORMAL LOW (ref 3.9–10.3)
lymph#: 1 10*3/uL (ref 0.9–3.3)

## 2014-09-25 MED ORDER — PACLITAXEL PROTEIN-BOUND CHEMO INJECTION 100 MG
100.0000 mg/m2 | Freq: Once | INTRAVENOUS | Status: AC
  Administered 2014-09-25: 175 mg via INTRAVENOUS
  Filled 2014-09-25: qty 35

## 2014-09-25 MED ORDER — DEXAMETHASONE SODIUM PHOSPHATE 10 MG/ML IJ SOLN
10.0000 mg | Freq: Once | INTRAMUSCULAR | Status: AC
  Administered 2014-09-25: 10 mg via INTRAVENOUS

## 2014-09-25 MED ORDER — ONDANSETRON 8 MG/50ML IVPB (CHCC)
8.0000 mg | Freq: Once | INTRAVENOUS | Status: AC
  Administered 2014-09-25: 8 mg via INTRAVENOUS

## 2014-09-25 MED ORDER — HEPARIN SOD (PORK) LOCK FLUSH 100 UNIT/ML IV SOLN
500.0000 [IU] | Freq: Once | INTRAVENOUS | Status: AC | PRN
  Administered 2014-09-25: 500 [IU]
  Filled 2014-09-25: qty 5

## 2014-09-25 MED ORDER — SODIUM CHLORIDE 0.9 % IV SOLN
Freq: Once | INTRAVENOUS | Status: AC
  Administered 2014-09-25: 10:00:00 via INTRAVENOUS

## 2014-09-25 MED ORDER — ONDANSETRON 8 MG/NS 50 ML IVPB
INTRAVENOUS | Status: AC
  Filled 2014-09-25: qty 8

## 2014-09-25 MED ORDER — SODIUM CHLORIDE 0.9 % IJ SOLN
10.0000 mL | INTRAMUSCULAR | Status: DC | PRN
  Administered 2014-09-25: 10 mL
  Filled 2014-09-25: qty 10

## 2014-09-25 MED ORDER — SODIUM CHLORIDE 0.9 % IV SOLN
1000.0000 mg/m2 | Freq: Once | INTRAVENOUS | Status: AC
  Administered 2014-09-25: 1862 mg via INTRAVENOUS
  Filled 2014-09-25: qty 48.97

## 2014-09-25 MED ORDER — DEXAMETHASONE SODIUM PHOSPHATE 10 MG/ML IJ SOLN
INTRAMUSCULAR | Status: AC
  Filled 2014-09-25: qty 1

## 2014-09-25 MED ORDER — ONDANSETRON HCL 8 MG PO TABS: 8.0000 mg | ORAL_TABLET | Freq: Three times a day (TID) | ORAL | Status: AC | PRN

## 2014-09-25 NOTE — Patient Instructions (Signed)
[facility] Discharge Instructions for Patients Receiving Chemotherapy  Today you received the following chemotherapy agents: Abraxane and Gemzar.  To help prevent nausea and vomiting after your treatment, we encourage you to take your nausea medication as prescribed.   If you develop nausea and vomiting that is not controlled by your nausea medication, call the clinic.   BELOW ARE SYMPTOMS THAT SHOULD BE REPORTED IMMEDIATELY:  *FEVER GREATER THAN 100.5 F  *CHILLS WITH OR WITHOUT FEVER  NAUSEA AND VOMITING THAT IS NOT CONTROLLED WITH YOUR NAUSEA MEDICATION  *UNUSUAL SHORTNESS OF BREATH  *UNUSUAL BRUISING OR BLEEDING  TENDERNESS IN MOUTH AND THROAT WITH OR WITHOUT PRESENCE OF ULCERS  *URINARY PROBLEMS  *BOWEL PROBLEMS  UNUSUAL RASH Items with * indicate a potential emergency and should be followed up as soon as possible.  Feel free to call the clinic you have any questions or concerns. The clinic phone number is [phone].

## 2014-09-25 NOTE — Progress Notes (Signed)
Nutrition follow-up completed with patient during chemotherapy.  Patient is being treated for pancreas cancer. Weight increased and documented as 138.8 pounds December 15, which is increased from 135.2 pounds. Currently patient denies nutrition impact symptoms other than ongoing nausea. Patient is having regular bowel movements. Appetite is normal. Abdominal pain is controlled.  Glucose remains elevated however is improved.  Nutrition diagnosis: Unintended weight loss improved.  Intervention: Recommended patient continue frequent meals and snacks throughout the day. Patient to continue Ensure Plus twice a day. RN to discuss possible additional nausea medications with M.D. Questions were answered.  Teach back method used.  Monitoring, evaluation, goals: Patient has been able to increase oral intake and has seen weight increase.  Next visit: Tuesday, January 12, during chemotherapy.  **Disclaimer: This note was dictated with voice recognition software. Similar sounding words can inadvertently be transcribed and this note may contain transcription errors which may not have been corrected upon publication of note.**

## 2014-09-26 NOTE — Progress Notes (Signed)
This encounter was created in error - please disregard.

## 2014-10-01 ENCOUNTER — Ambulatory Visit (HOSPITAL_BASED_OUTPATIENT_CLINIC_OR_DEPARTMENT_OTHER): Payer: PRIVATE HEALTH INSURANCE | Admitting: Physician Assistant

## 2014-10-01 ENCOUNTER — Other Ambulatory Visit (HOSPITAL_BASED_OUTPATIENT_CLINIC_OR_DEPARTMENT_OTHER): Payer: PRIVATE HEALTH INSURANCE

## 2014-10-01 ENCOUNTER — Encounter: Payer: Self-pay | Admitting: Physician Assistant

## 2014-10-01 VITALS — BP 120/74 | HR 93 | Temp 97.6°F | Resp 18 | Ht 65.0 in | Wt 137.9 lb

## 2014-10-01 DIAGNOSIS — D696 Thrombocytopenia, unspecified: Secondary | ICD-10-CM

## 2014-10-01 DIAGNOSIS — C252 Malignant neoplasm of tail of pancreas: Secondary | ICD-10-CM

## 2014-10-01 DIAGNOSIS — C787 Secondary malignant neoplasm of liver and intrahepatic bile duct: Secondary | ICD-10-CM

## 2014-10-01 DIAGNOSIS — C786 Secondary malignant neoplasm of retroperitoneum and peritoneum: Secondary | ICD-10-CM

## 2014-10-01 DIAGNOSIS — I82402 Acute embolism and thrombosis of unspecified deep veins of left lower extremity: Secondary | ICD-10-CM

## 2014-10-01 DIAGNOSIS — C259 Malignant neoplasm of pancreas, unspecified: Secondary | ICD-10-CM

## 2014-10-01 LAB — COMPREHENSIVE METABOLIC PANEL (CC13)
ALT: 19 U/L (ref 0–55)
ANION GAP: 10 meq/L (ref 3–11)
AST: 19 U/L (ref 5–34)
Albumin: 2.7 g/dL — ABNORMAL LOW (ref 3.5–5.0)
Alkaline Phosphatase: 128 U/L (ref 40–150)
BUN: 5.2 mg/dL — AB (ref 7.0–26.0)
CHLORIDE: 100 meq/L (ref 98–109)
CO2: 27 mEq/L (ref 22–29)
CREATININE: 0.7 mg/dL (ref 0.6–1.1)
Calcium: 8.7 mg/dL (ref 8.4–10.4)
GLUCOSE: 297 mg/dL — AB (ref 70–140)
POTASSIUM: 3.6 meq/L (ref 3.5–5.1)
SODIUM: 137 meq/L (ref 136–145)
Total Bilirubin: 0.6 mg/dL (ref 0.20–1.20)
Total Protein: 5.8 g/dL — ABNORMAL LOW (ref 6.4–8.3)

## 2014-10-01 LAB — CBC WITH DIFFERENTIAL/PLATELET
BASO%: 0.8 % (ref 0.0–2.0)
Basophils Absolute: 0 10*3/uL (ref 0.0–0.1)
EOS%: 0.2 % (ref 0.0–7.0)
Eosinophils Absolute: 0 10*3/uL (ref 0.0–0.5)
HCT: 31.6 % — ABNORMAL LOW (ref 34.8–46.6)
HGB: 10.1 g/dL — ABNORMAL LOW (ref 11.6–15.9)
LYMPH%: 28.9 % (ref 14.0–49.7)
MCH: 30.9 pg (ref 25.1–34.0)
MCHC: 31.9 g/dL (ref 31.5–36.0)
MCV: 97 fL (ref 79.5–101.0)
MONO#: 0.4 10*3/uL (ref 0.1–0.9)
MONO%: 11.3 % (ref 0.0–14.0)
NEUT#: 1.9 10*3/uL (ref 1.5–6.5)
NEUT%: 58.8 % (ref 38.4–76.8)
Platelets: 71 10*3/uL — ABNORMAL LOW (ref 145–400)
RBC: 3.26 10*6/uL — AB (ref 3.70–5.45)
RDW: 18.4 % — ABNORMAL HIGH (ref 11.2–14.5)
WBC: 3.3 10*3/uL — ABNORMAL LOW (ref 3.9–10.3)
lymph#: 1 10*3/uL (ref 0.9–3.3)

## 2014-10-01 MED ORDER — OXYCODONE HCL 5 MG PO TABS: 5.0000 mg | ORAL_TABLET | ORAL | Status: DC | PRN

## 2014-10-01 NOTE — Patient Instructions (Signed)
Your platelet count is too low to proceed with chemotherapy as scheduled today. Notify our office immediately if you develop any unusual bleeding or bruising Follow-up in 2 weeks at the start of your next scheduled cycle of chemotherapy

## 2014-10-01 NOTE — Progress Notes (Signed)
Hematology and Oncology Follow Up Visit  Christine Gould [id number] 1956-07-02 58 y.o. 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 This was diagnosed in February of 2015. Her CA 19-9 was 37,431.  Prior Therapy: She is status post endoscopic ultrasound and a biopsy completed on 11/07/2013 confirmed the presence of adenocarcinoma.  Current therapy: She is receiving palliative chemotherapy in the form of Gemzar and Abraxane given on day 1, day 8 of 21 day cycle. Status post 7 cycles completed on 04/03/2014. She resumed this same treatment on 07/10/2014.   Secondary diagnosis: She developed left deep vein thrombosis on 12/12/2013 she was treated with Lovenox for 6 months.  Interim History:  Christine Gould presents today for a followup visit with her husband. She tolerated chemotherapy last cycle without any complications. She reports some nausea that is well-controlled with her current antiemetics. She does request a refill for her pain medication. She continues to have her baseline level of neuropathy which has not progressed. She continues to ambulate without any major difficulty using a cane. She did gain 1 pound since the last visit but for the most part appetite have been reasonable. She denies falls or syncope. She still has a reasonable quality of life. She denies hematochezia or melena.She denied any decline in her performance status. She denied any alteration of mental status with confusion. She denies any other complications. She has not reported any dizziness or seizure activity. Did not report any lymphadenopathy or petechiae. Did not report any skin rashes or lesions.  She denied any skeletal complaints or pains. Has not reported any mood disturbance. Her pain continues to be under reasonable control.  Remainder of her review of systems unremarkable.  Medications: I have reviewed the patient's current medications.  Current Outpatient Prescriptions  Medication Sig Dispense Refill  . acetaminophen (TYLENOL) 500 MG tablet Take 1,000 mg by mouth every 6 (six) hours as needed for mild pain or moderate pain.    Marland Kitchen ALPRAZolam (XANAX) 0.5 MG tablet Take 0.5 mg by mouth 3 (three) times daily as needed.     Marland Kitchen amLODipine (NORVASC) 10 MG tablet Take 10 mg by mouth every morning.    . citalopram (CELEXA) 40 MG tablet   3  . ibuprofen (ADVIL,MOTRIN) 200 MG tablet Take 400 mg by mouth every 6 (six) hours as needed for mild pain or moderate pain.    Marland Kitchen lidocaine-prilocaine (EMLA) cream Apply 1 application topically daily as needed (port).    . magnesium oxide (MAG-OX) 400 (241.3 MG) MG tablet Take 1 tablet (400 mg total) by mouth 2 (two) times daily. 30 tablet 0  . metFORMIN (GLUCOPHAGE) 1000 MG tablet Take 1,000 mg by mouth 2 (two) times daily with a meal.    . ondansetron (ZOFRAN) 8 MG tablet Take 1 tablet (8 mg total) by mouth every 8 (eight) hours as needed for nausea or vomiting. 20 tablet 0  . oxyCODONE (OXY IR/ROXICODONE) 5 MG immediate release tablet Take 1 tablet (5 mg total) by mouth every 4 (four) hours as needed for severe pain. 30 tablet 0  . potassium chloride SA (K-DUR,KLOR-CON) 20 MEQ tablet Take 2 tablets (40 mEq total) by mouth once. 18 tablet 0  . PRESCRIPTION MEDICATION Chemotherapy regimen    . prochlorperazine (COMPAZINE) 10 MG tablet TAKE 1 TABLET BY MOUTH EVERY 6 HOURS AS NEEDED FOR NAUSEA  OR VOMITING 30 tablet 1  . PROCTOZONE-HC 2.5 % rectal cream Apply 1 application topically as needed.  0  . traZODone (DESYREL) 100 MG tablet Take 100 mg by mouth at bedtime.     No current facility-administered medications for this visit.     Allergies:  Allergies  Allergen Reactions  . Statins Other (See Comments)    Cramps-fatigue  . Toprol Xl [Metoprolol] Other (See Comments)    Pain,  cramps, fatigue     Past Medical History, Surgical history, Social history, and Family History were reviewed and updated.   Physical Exam: Blood pressure 120/74, pulse 93, temperature 97.6 F (36.4 C), temperature source Oral, resp. rate 18, height 5\' 5"  (1.651 m), weight 137 lb 14.4 oz (62.551 kg).  ECOG: 1 General appearance: alert awake not in any distress. Head: Normocephalic, without obvious abnormality Neck: no adenopathy Lymph nodes: Cervical, supraclavicular, and axillary nodes normal. Heart:regular rate and rhythm, S1, S2. Lung:chest clear, no wheezing, no dullness to percussion to Abdomin: No ascites or shifting dullness. No tenderness on deep palpation. Good bowel sounds. EXT: 1+ bilaterally. Neurological examination: No deficits noted on today's exam  Lab Results: Lab Results  Component Value Date   WBC 3.3* 10/01/2014   HGB 10.1* 10/01/2014   HCT 31.6* 10/01/2014   MCV 97.0 10/01/2014   PLT 71* 10/01/2014     Chemistry      Component Value Date/Time   NA 137 10/01/2014 1448   NA 135* 04/08/2014 1130   K 3.6 10/01/2014 1448   K 3.4* 04/08/2014 1130   CL 96 04/08/2014 1130   CO2 27 10/01/2014 1448   CO2 23 04/08/2014 1130   BUN 5.2* 10/01/2014 1448   BUN 14 04/08/2014 1130   CREATININE 0.7 10/01/2014 1448   CREATININE 0.52 04/08/2014 1130      Component Value Date/Time   CALCIUM 8.7 10/01/2014 1448   CALCIUM 8.7 04/08/2014 1130   ALKPHOS 128 10/01/2014 1448   ALKPHOS 82 04/08/2014 1130   AST 19 10/01/2014 1448   AST 33 04/08/2014 1130   ALT 19 10/01/2014 1448   ALT 58* 04/08/2014 1130   BILITOT 0.60 10/01/2014 1448   BILITOT 0.3 04/08/2014 1130     Results for Christine Gould, Christine Gould (MRN [id number]) as of 09/18/2014 09:11  Ref. Range 07/31/2014 09:11 08/21/2014 11:20 08/21/2014 11:20 09/17/2014 08:34  CA 19-9 Latest Range: <35.0 U/mL 12051.9 (H)   4258.4 (H)    EXAM: CT CHEST, ABDOMEN, AND PELVIS WITH CONTRAST  TECHNIQUE: Multidetector CT imaging  of the chest, abdomen and pelvis was performed following the standard protocol during bolus administration of intravenous contrast.  CONTRAST: 132mL OMNIPAQUE IOHEXOL 300 MG/ML SOLN  COMPARISON: 07/05/2014.  FINDINGS: CT CHEST FINDINGS  Right IJ Port-A-Cath terminates at the SVC RA junction. No pathologically enlarged mediastinal, hilar or axillary lymph nodes. Coronary artery calcification. Heart size within normal limits. No pericardial effusion.  Mild subpleural peribronchovascular nodularity in the right lower lobe, unchanged and likely post infectious or postinflammatory in etiology. Lungs are otherwise clear. No pleural fluid. Airway is unremarkable.  CT ABDOMEN AND PELVIS FINDINGS  Hepatobiliary: Liver margin is irregular. Low-attenuation lesion in the inferior right hepatic lobe measures 8 mm, stable. Very mild intrahepatic biliary ductal dilatation is unchanged. Cholecystectomy. Extrahepatic bile duct is normal in caliber.  Pancreas: Pancreatic tail mass is somewhat ill-defined, measuring approximately 2.1 x 2.8 cm (series 2, image 40), stable. Mild surrounding haziness. Pancreas is otherwise unremarkable.  Spleen: Negative.  Adrenals/Urinary Tract: Adrenal  glands are unremarkable. Renal cortical scarring bilaterally. Low-attenuation lesions in the kidneys measure up to 2.0 cm in the right renal sinus, likely cysts. Kidneys are otherwise unremarkable. Ureters are decompressed. Bladder appears thick-walled but is somewhat under distended.  Stomach/Bowel: Stomach is grossly unremarkable. Proximal duodenum appears slightly thick walled. Small bowel and colon are otherwise grossly unremarkable.  Vascular/Lymphatic: Atherosclerotic calcification of the arterial vasculature without abdominal aortic aneurysm. Splenic vein occlusion, as before, with associated varices. Additional varices in the upper abdomen, surrounding the stomach,  gastroesophageal junction and distal esophagus. Recanalized paraumbilical vein. Retroperitoneal lymph nodes are not enlarged by CT size criteria.  Reproductive: Uterus and ovaries are visualized.  Other: Omental nodularity has improved somewhat in the interval. A previously measured conglomerate collection of lymph nodes in the left lower quadrant has decreased in size. Largest individual nodule measures 0.8 x 1.3 cm (series 6, image 123), previously 1.4 x 2.0 cm. Small to moderate ascites, increased.  Musculoskeletal: No worrisome lytic or sclerotic lesions. Degenerative changes are seen in the spine.  IMPRESSION: 1. Pancreatic tail mass is stable. 2. Omental nodularity has improved slightly in the interval. 3. Small to moderate ascites, increased. 4. Cirrhosis with prominent varices. 5. Bladder appears thick-walled but is suboptimally distended.   Impression and Plan:   58 year old woman with the following issues:   1. 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 She is S/P palliative chemotherapy in the form of Gemzar and Abraxane.  CT scan from 04/17/2014  showed a dramatic response to systemic chemotherapy with very little residual cancer.  Followup CT scan on 07/05/2014  Showed  progression of disease. Given these new findings, she resumed systemic chemotherapy with Gemzar and Abraxane.  Chemotherapy is given on day 1, day 8 of a 21 day cycle.   CT scan results on 09/17/2014 was discussed today and showed overall response to systemic chemotherapy. The plan is to resume chemotherapy after one-week delay related to neutropenia.   2. Abdominal/epigastric pain: She is currently utilizing oxycodone with good relief. She uses it 2 or 3 times a week. Chemotherapy have improved her overall abdominal pain.  3. IV access: She has a Port-A-Cath in place.this will be  utilized for systemic chemotherapy.  4. Nausea prophylaxis: She has antiemetics prescribed.   5.Thrombosis: Of the left lower extremity. She is off Lovenox at this time.  6. Constipation: Likely related to systemic chemotherapy as well as narcotic analgesics. Educated her again about using laxatives on a daily basis.  7. Thrombocytopenia: Her platelet count is low at 71,000. We will skip day 8 of cycle #11(cycle #4 of resumed therapy)   8. Followup: In 2 weeks  for the start of next cycle of chemotherapy.    Awilda Metro E, PA-C  12/28/20154:15 PM

## 2014-10-02 ENCOUNTER — Other Ambulatory Visit: Payer: PRIVATE HEALTH INSURANCE

## 2014-10-02 ENCOUNTER — Ambulatory Visit: Payer: PRIVATE HEALTH INSURANCE | Admitting: Physician Assistant

## 2014-10-02 ENCOUNTER — Ambulatory Visit: Payer: PRIVATE HEALTH INSURANCE

## 2014-10-16 ENCOUNTER — Ambulatory Visit (HOSPITAL_BASED_OUTPATIENT_CLINIC_OR_DEPARTMENT_OTHER): Payer: PRIVATE HEALTH INSURANCE

## 2014-10-16 ENCOUNTER — Ambulatory Visit (HOSPITAL_BASED_OUTPATIENT_CLINIC_OR_DEPARTMENT_OTHER): Payer: PRIVATE HEALTH INSURANCE | Admitting: Oncology

## 2014-10-16 ENCOUNTER — Other Ambulatory Visit (HOSPITAL_BASED_OUTPATIENT_CLINIC_OR_DEPARTMENT_OTHER): Payer: PRIVATE HEALTH INSURANCE

## 2014-10-16 ENCOUNTER — Ambulatory Visit: Payer: PRIVATE HEALTH INSURANCE | Admitting: Nutrition

## 2014-10-16 VITALS — BP 128/84 | Resp 18 | Ht 65.0 in | Wt 135.7 lb

## 2014-10-16 DIAGNOSIS — I82402 Acute embolism and thrombosis of unspecified deep veins of left lower extremity: Secondary | ICD-10-CM

## 2014-10-16 DIAGNOSIS — C787 Secondary malignant neoplasm of liver and intrahepatic bile duct: Secondary | ICD-10-CM

## 2014-10-16 DIAGNOSIS — C259 Malignant neoplasm of pancreas, unspecified: Secondary | ICD-10-CM

## 2014-10-16 DIAGNOSIS — C786 Secondary malignant neoplasm of retroperitoneum and peritoneum: Secondary | ICD-10-CM

## 2014-10-16 DIAGNOSIS — D696 Thrombocytopenia, unspecified: Secondary | ICD-10-CM

## 2014-10-16 DIAGNOSIS — C252 Malignant neoplasm of tail of pancreas: Secondary | ICD-10-CM

## 2014-10-16 DIAGNOSIS — Z5111 Encounter for antineoplastic chemotherapy: Secondary | ICD-10-CM

## 2014-10-16 DIAGNOSIS — D701 Agranulocytosis secondary to cancer chemotherapy: Secondary | ICD-10-CM

## 2014-10-16 DIAGNOSIS — K5900 Constipation, unspecified: Secondary | ICD-10-CM

## 2014-10-16 DIAGNOSIS — R109 Unspecified abdominal pain: Secondary | ICD-10-CM

## 2014-10-16 DIAGNOSIS — C250 Malignant neoplasm of head of pancreas: Secondary | ICD-10-CM

## 2014-10-16 LAB — CBC WITH DIFFERENTIAL/PLATELET
BASO%: 0.5 % (ref 0.0–2.0)
BASOS ABS: 0 10*3/uL (ref 0.0–0.1)
EOS%: 0.9 % (ref 0.0–7.0)
Eosinophils Absolute: 0 10*3/uL (ref 0.0–0.5)
HCT: 42.2 % (ref 34.8–46.6)
HGB: 13.6 g/dL (ref 11.6–15.9)
LYMPH#: 1.2 10*3/uL (ref 0.9–3.3)
LYMPH%: 28.2 % (ref 14.0–49.7)
MCH: 30.5 pg (ref 25.1–34.0)
MCHC: 32.2 g/dL (ref 31.5–36.0)
MCV: 94.6 fL (ref 79.5–101.0)
MONO#: 0.5 10*3/uL (ref 0.1–0.9)
MONO%: 12.1 % (ref 0.0–14.0)
NEUT#: 2.5 10*3/uL (ref 1.5–6.5)
NEUT%: 58.3 % (ref 38.4–76.8)
Platelets: 162 10*3/uL (ref 145–400)
RBC: 4.46 10*6/uL (ref 3.70–5.45)
RDW: 15.9 % — ABNORMAL HIGH (ref 11.2–14.5)
WBC: 4.3 10*3/uL (ref 3.9–10.3)

## 2014-10-16 LAB — COMPREHENSIVE METABOLIC PANEL (CC13)
ALT: 19 U/L (ref 0–55)
AST: 26 U/L (ref 5–34)
Albumin: 3.2 g/dL — ABNORMAL LOW (ref 3.5–5.0)
Alkaline Phosphatase: 176 U/L — ABNORMAL HIGH (ref 40–150)
Anion Gap: 12 mEq/L — ABNORMAL HIGH (ref 3–11)
BUN: 3.8 mg/dL — ABNORMAL LOW (ref 7.0–26.0)
CO2: 30 meq/L — AB (ref 22–29)
Calcium: 9.2 mg/dL (ref 8.4–10.4)
Chloride: 102 mEq/L (ref 98–109)
Creatinine: 0.7 mg/dL (ref 0.6–1.1)
EGFR: >90 mL/min/{1.73_m2} (ref >90)
Glucose: 164 mg/dl — ABNORMAL HIGH (ref 70–140)
Potassium: 3.7 mEq/L (ref 3.5–5.1)
SODIUM: 143 meq/L (ref 136–145)
TOTAL PROTEIN: 6.6 g/dL (ref 6.4–8.3)
Total Bilirubin: 0.57 mg/dL (ref 0.20–1.20)

## 2014-10-16 MED ORDER — DEXAMETHASONE SODIUM PHOSPHATE 10 MG/ML IJ SOLN
INTRAMUSCULAR | Status: AC
  Filled 2014-10-16: qty 1

## 2014-10-16 MED ORDER — PACLITAXEL PROTEIN-BOUND CHEMO INJECTION 100 MG
75.0000 mg/m2 | Freq: Once | INTRAVENOUS | Status: AC
  Administered 2014-10-16: 125 mg via INTRAVENOUS
  Filled 2014-10-16: qty 25

## 2014-10-16 MED ORDER — HEPARIN SOD (PORK) LOCK FLUSH 100 UNIT/ML IV SOLN
500.0000 [IU] | Freq: Once | INTRAVENOUS | Status: AC | PRN
  Administered 2014-10-16: 500 [IU]
  Filled 2014-10-16: qty 5

## 2014-10-16 MED ORDER — DEXAMETHASONE SODIUM PHOSPHATE 10 MG/ML IJ SOLN
10.0000 mg | Freq: Once | INTRAMUSCULAR | Status: AC
  Administered 2014-10-16: 10 mg via INTRAVENOUS

## 2014-10-16 MED ORDER — ONDANSETRON 8 MG/50ML IVPB (CHCC)
8.0000 mg | Freq: Once | INTRAVENOUS | Status: AC
  Administered 2014-10-16: 8 mg via INTRAVENOUS

## 2014-10-16 MED ORDER — SODIUM CHLORIDE 0.9 % IJ SOLN
10.0000 mL | INTRAMUSCULAR | Status: DC | PRN
  Administered 2014-10-16: 10 mL
  Filled 2014-10-16: qty 10

## 2014-10-16 MED ORDER — ONDANSETRON 8 MG/NS 50 ML IVPB
INTRAVENOUS | Status: AC
  Filled 2014-10-16: qty 8

## 2014-10-16 MED ORDER — SODIUM CHLORIDE 0.9 % IV SOLN
Freq: Once | INTRAVENOUS | Status: AC
  Administered 2014-10-16: 10:00:00 via INTRAVENOUS

## 2014-10-16 MED ORDER — GEMCITABINE HCL CHEMO INJECTION 1 GM/26.3ML
800.0000 mg/m2 | Freq: Once | INTRAVENOUS | Status: AC
  Administered 2014-10-16: 1368 mg via INTRAVENOUS
  Filled 2014-10-16: qty 35.98

## 2014-10-16 NOTE — Progress Notes (Signed)
Nutrition follow-up completed with patient during chemotherapy.   Patient is being treated for pancreas cancer. Patient reports increased pain. Patient denies increased pain decreases appetite. Nausea is controlled and bowel movements are regular. Weight documented as 137.9 pounds December 28, down slightly from 138.8 pounds December 15.  Nutrition diagnosis: Unintended weight loss continues.  Intervention:  Recommended patient continue small meals and snacks with Ensure Plus as needed. Recommended patient continue nausea medication and pain medication as prescribed. Questions were answered.  Teach back method used.  Monitoring, evaluation, goals: Patient will work to continue adequate calories and protein for weight maintenance.  Next visit: Patient will contact me with questions.  No follow-up scheduled at this time.  **Disclaimer: This note was dictated with voice recognition software. Similar sounding words can inadvertently be transcribed and this note may contain transcription errors which may not have been corrected upon publication of note.**

## 2014-10-16 NOTE — Patient Instructions (Signed)
[facility] Discharge Instructions for Patients Receiving Chemotherapy  Today you received the following chemotherapy agents Abraxane/Gemcitabine.   To help prevent nausea and vomiting after your treatment, we encourage you to take your nausea medication as directed.    If you develop nausea and vomiting that is not controlled by your nausea medication, call the clinic.   BELOW ARE SYMPTOMS THAT SHOULD BE REPORTED IMMEDIATELY:  *FEVER GREATER THAN 100.5 F  *CHILLS WITH OR WITHOUT FEVER  NAUSEA AND VOMITING THAT IS NOT CONTROLLED WITH YOUR NAUSEA MEDICATION  *UNUSUAL SHORTNESS OF BREATH  *UNUSUAL BRUISING OR BLEEDING  TENDERNESS IN MOUTH AND THROAT WITH OR WITHOUT PRESENCE OF ULCERS  *URINARY PROBLEMS  *BOWEL PROBLEMS  UNUSUAL RASH Items with * indicate a potential emergency and should be followed up as soon as possible.  Feel free to call the clinic you have any questions or concerns. The clinic phone number is [phone].

## 2014-10-16 NOTE — Progress Notes (Signed)
Hematology and Oncology Follow Up Visit  Christine Gould [id number] 11-Aug-1956 59 y.o. 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 This was diagnosed in February of 2015. Her CA 19-9 was 37,431.  Prior Therapy: She is status post endoscopic ultrasound and a biopsy completed on 11/07/2013 confirmed the presence of adenocarcinoma.  Current therapy: She is receiving palliative chemotherapy in the form of Gemzar and Abraxane given on day 1, day 8 of 21 day cycle. Status post 7 cycles completed on 04/03/2014. She resumed this same treatment on 07/10/2014.   Secondary diagnosis: She developed left deep vein thrombosis on 12/12/2013 she was treated with Lovenox for 6 months.  Interim History:  Christine Gould presents today for a followup visit with her husband. Since the last visit, she is doing rather poorly. She is reporting diffuse abdominal pain with increase in her abdominal fullness and pressure. Her pain is predominantly epigastric and left upper quadrant. She reports some nausea that is well-controlled with her current antiemetics. She does take oxycodone 1 and sometimes twice a day. She continues to have her baseline level of neuropathy which has not progressed. She continues to ambulate without any major difficulty using a cane.  She denies falls or syncope. She still has a reasonable quality of life. She denies hematochezia or melena.She denied any decline in her performance status. She denied any alteration of mental status with confusion. She denies any other complications. She has not reported any dizziness or seizure activity. Did not report any lymphadenopathy or petechiae. Did not report any skin rashes or lesions.  She denied any skeletal complaints or pains. Has not reported any mood disturbance. Her pain  continues to be under reasonable control. Remainder of her review of systems unremarkable.  Medications: I have reviewed the patient's current medications.  Current Outpatient Prescriptions  Medication Sig Dispense Refill  . acetaminophen (TYLENOL) 500 MG tablet Take 1,000 mg by mouth every 6 (six) hours as needed for mild pain or moderate pain.    Marland Kitchen ALPRAZolam (XANAX) 0.5 MG tablet Take 0.5 mg by mouth 3 (three) times daily as needed.     Marland Kitchen amLODipine (NORVASC) 10 MG tablet Take 10 mg by mouth every morning.    . citalopram (CELEXA) 40 MG tablet   3  . ibuprofen (ADVIL,MOTRIN) 200 MG tablet Take 400 mg by mouth every 6 (six) hours as needed for mild pain or moderate pain.    Marland Kitchen lidocaine-prilocaine (EMLA) cream Apply 1 application topically daily as needed (port).    . magnesium oxide (MAG-OX) 400 (241.3 MG) MG tablet Take 1 tablet (400 mg total) by mouth 2 (two) times daily. 30 tablet 0  . metFORMIN (GLUCOPHAGE) 1000 MG tablet Take 1,000 mg by mouth 2 (two) times daily with a meal.    . ondansetron (ZOFRAN) 8 MG tablet Take 1 tablet (8 mg total) by mouth every 8 (eight) hours as needed for nausea or vomiting. 20 tablet 0  . oxyCODONE (OXY IR/ROXICODONE) 5 MG immediate release tablet Take 1 tablet (5 mg total) by mouth every 4 (four) hours as needed for severe pain. 30 tablet 0  . potassium chloride SA (K-DUR,KLOR-CON) 20 MEQ tablet Take 2 tablets (40 mEq total) by mouth once. 18 tablet 0  . PRESCRIPTION MEDICATION Chemotherapy regimen    . prochlorperazine (COMPAZINE) 10 MG tablet TAKE 1 TABLET BY  MOUTH EVERY 6 HOURS AS NEEDED FOR NAUSEA OR VOMITING 30 tablet 1  . PROCTOZONE-HC 2.5 % rectal cream Apply 1 application topically as needed.  0  . traZODone (DESYREL) 100 MG tablet Take 100 mg by mouth at bedtime.     No current facility-administered medications for this visit.     Allergies:  Allergies  Allergen Reactions  . Statins Other (See Comments)    Cramps-fatigue  . Toprol Xl  [Metoprolol] Other (See Comments)    Pain, cramps, fatigue     Past Medical History, Surgical history, Social history, and Family History were reviewed and updated.   Physical Exam: Blood pressure 128/84, temperature 0 F (-17.8 C), resp. rate 18, height 5\' 5"  (1.651 m), weight 135 lb 11.2 oz (61.553 kg), SpO2 100 %.  ECOG: 1 General appearance: alert awake not in any distress. Head: Normocephalic, without obvious abnormality Neck: no adenopathy Lymph nodes: Cervical, supraclavicular, and axillary nodes normal. Heart:regular rate and rhythm, S1, S2. Lung:chest clear, no wheezing, no dullness to percussion to Abdomin: No ascites or shifting dullness. No tenderness on deep palpation. Increased abdominal fullness without any shifting dullness. EXT: 1+ bilaterally unchanged from previous examination.  Neurological examination: No deficits noted on today's exam  Lab Results: Lab Results  Component Value Date   WBC 4.3 10/16/2014   HGB 13.6 10/16/2014   HCT 42.2 10/16/2014   MCV 94.6 10/16/2014   PLT 162 10/16/2014     Chemistry      Component Value Date/Time   NA 143 10/16/2014 0826   NA 135* 04/08/2014 1130   K 3.7 10/16/2014 0826   K 3.4* 04/08/2014 1130   CL 96 04/08/2014 1130   CO2 30* 10/16/2014 0826   CO2 23 04/08/2014 1130   BUN 3.8* 10/16/2014 0826   BUN 14 04/08/2014 1130   CREATININE 0.7 10/16/2014 0826   CREATININE 0.52 04/08/2014 1130      Component Value Date/Time   CALCIUM 9.2 10/16/2014 0826   CALCIUM 8.7 04/08/2014 1130   ALKPHOS 176* 10/16/2014 0826   ALKPHOS 82 04/08/2014 1130   AST 26 10/16/2014 0826   AST 33 04/08/2014 1130   ALT 19 10/16/2014 0826   ALT 58* 04/08/2014 1130   BILITOT 0.57 10/16/2014 0826   BILITOT 0.3 04/08/2014 1130     Impression and Plan:   59 year old woman with the following issues:   1. Stage IV pancreatic adenocarcinoma arising from the tail of the pancreas with associated lymphadenopathy, hepatic metastasis and  peritoneal carcinomatosis. She is S/P palliative chemotherapy in the form of Gemzar and Abraxane.  CT scan from 04/17/2014  showed a dramatic response to systemic chemotherapy with very little residual cancer.  Followup CT scan on 07/05/2014  Showed  progression of disease. Given these new findings, she resumed systemic chemotherapy with Gemzar and Abraxane.  Chemotherapy is given on day 1, day 8 of a 21 day cycle.   CT scan results on 09/17/2014  showed overall response to systemic chemotherapy. Chemotherapy was delayed due to neutropenia and the last cycle. The plan is to resume chemotherapy with 25% dose reduction due to neutropenia. She'll receive day 1 of the next cycle of chemotherapy today.   2. Abdominal/epigastric pain: She is currently utilizing oxycodone with good relief. I encouraged her to schedule oxycodone around-the-clock 3 times a day and certainly we can use long-acting medication if this fails.  3. IV access: She has a Port-A-Cath in place.this will be utilized for systemic chemotherapy.  4. Nausea  prophylaxis: She has antiemetics prescribed.   5.Thrombosis: Of the left lower extremity. She is off Lovenox at this time.  6. Constipation: Likely related to systemic chemotherapy as well as narcotic analgesics. His is improved at this time.  7. Thrombocytopenia: Her platelet count is normal today and will resume chemotherapy as scheduled. I have reduced her dose of chemotherapy by 25%.  8. Followup: In one week for day 8 of this cycle of chemotherapy.     [facility], MD 1/12/20169:16 AM

## 2014-10-17 ENCOUNTER — Telehealth: Payer: Self-pay | Admitting: Oncology

## 2014-10-17 NOTE — Telephone Encounter (Signed)
, °

## 2014-10-23 ENCOUNTER — Other Ambulatory Visit (HOSPITAL_BASED_OUTPATIENT_CLINIC_OR_DEPARTMENT_OTHER): Payer: PRIVATE HEALTH INSURANCE

## 2014-10-23 ENCOUNTER — Ambulatory Visit: Payer: PRIVATE HEALTH INSURANCE

## 2014-10-23 DIAGNOSIS — C252 Malignant neoplasm of tail of pancreas: Secondary | ICD-10-CM

## 2014-10-23 DIAGNOSIS — C259 Malignant neoplasm of pancreas, unspecified: Secondary | ICD-10-CM

## 2014-10-23 LAB — COMPREHENSIVE METABOLIC PANEL (CC13)
ALK PHOS: 137 U/L (ref 40–150)
ALT: 19 U/L (ref 0–55)
AST: 22 U/L (ref 5–34)
Albumin: 2.8 g/dL — ABNORMAL LOW (ref 3.5–5.0)
Anion Gap: 10 mEq/L (ref 3–11)
BUN: 3 mg/dL — ABNORMAL LOW (ref 7.0–26.0)
CALCIUM: 8.5 mg/dL (ref 8.4–10.4)
CO2: 28 mEq/L (ref 22–29)
Chloride: 100 mEq/L (ref 98–109)
Creatinine: 0.7 mg/dL (ref 0.6–1.1)
EGFR: >90 mL/min/{1.73_m2} (ref >90)
Glucose: 258 mg/dl — ABNORMAL HIGH (ref 70–140)
Potassium: 3.4 mEq/L — ABNORMAL LOW (ref 3.5–5.1)
Sodium: 137 mEq/L (ref 136–145)
Total Bilirubin: 0.49 mg/dL (ref 0.20–1.20)
Total Protein: 6.1 g/dL — ABNORMAL LOW (ref 6.4–8.3)

## 2014-10-23 LAB — CBC WITH DIFFERENTIAL/PLATELET
BASO%: 0.4 % (ref 0.0–2.0)
Basophils Absolute: 0 10*3/uL (ref 0.0–0.1)
EOS%: 0.1 % (ref 0.0–7.0)
Eosinophils Absolute: 0 10*3/uL (ref 0.0–0.5)
HCT: 35.9 % (ref 34.8–46.6)
HGB: 11.2 g/dL — ABNORMAL LOW (ref 11.6–15.9)
LYMPH%: 26.5 % (ref 14.0–49.7)
MCH: 29.6 pg (ref 25.1–34.0)
MCHC: 31.2 g/dL — ABNORMAL LOW (ref 31.5–36.0)
MCV: 94.8 fL (ref 79.5–101.0)
MONO#: 0.5 10*3/uL (ref 0.1–0.9)
MONO%: 13 % (ref 0.0–14.0)
NEUT#: 2.2 10*3/uL (ref 1.5–6.5)
NEUT%: 60 % (ref 38.4–76.8)
Platelets: 57 10*3/uL — ABNORMAL LOW (ref 145–400)
RBC: 3.78 10*6/uL (ref 3.70–5.45)
RDW: 16.7 % — ABNORMAL HIGH (ref 11.2–14.5)
WBC: 3.7 10*3/uL — ABNORMAL LOW (ref 3.9–10.3)
lymph#: 1 10*3/uL (ref 0.9–3.3)

## 2014-10-23 NOTE — Progress Notes (Signed)
Infusion cancelled today due to plt count of 57,000, per Dr Alen Blew.  Spoke to patient in lobby.  Pt given copy of labs and upcoming appointments.  Pt instructed to notify MD for abnormal bruising/bleeding.  Pt verbalized understanding.

## 2014-11-06 ENCOUNTER — Ambulatory Visit (HOSPITAL_BASED_OUTPATIENT_CLINIC_OR_DEPARTMENT_OTHER): Payer: PRIVATE HEALTH INSURANCE | Admitting: Physician Assistant

## 2014-11-06 ENCOUNTER — Other Ambulatory Visit (HOSPITAL_BASED_OUTPATIENT_CLINIC_OR_DEPARTMENT_OTHER): Payer: PRIVATE HEALTH INSURANCE

## 2014-11-06 ENCOUNTER — Ambulatory Visit (HOSPITAL_BASED_OUTPATIENT_CLINIC_OR_DEPARTMENT_OTHER): Payer: PRIVATE HEALTH INSURANCE

## 2014-11-06 ENCOUNTER — Encounter: Payer: Self-pay | Admitting: Physician Assistant

## 2014-11-06 ENCOUNTER — Telehealth: Payer: Self-pay | Admitting: Physician Assistant

## 2014-11-06 ENCOUNTER — Ambulatory Visit: Payer: PRIVATE HEALTH INSURANCE | Admitting: Nutrition

## 2014-11-06 VITALS — BP 139/87 | HR 106 | Temp 98.0°F | Resp 18 | Ht 65.0 in | Wt 142.2 lb

## 2014-11-06 DIAGNOSIS — D696 Thrombocytopenia, unspecified: Secondary | ICD-10-CM

## 2014-11-06 DIAGNOSIS — C252 Malignant neoplasm of tail of pancreas: Secondary | ICD-10-CM

## 2014-11-06 DIAGNOSIS — C250 Malignant neoplasm of head of pancreas: Secondary | ICD-10-CM

## 2014-11-06 DIAGNOSIS — C787 Secondary malignant neoplasm of liver and intrahepatic bile duct: Secondary | ICD-10-CM

## 2014-11-06 DIAGNOSIS — C259 Malignant neoplasm of pancreas, unspecified: Secondary | ICD-10-CM

## 2014-11-06 DIAGNOSIS — B379 Candidiasis, unspecified: Secondary | ICD-10-CM

## 2014-11-06 DIAGNOSIS — R110 Nausea: Secondary | ICD-10-CM

## 2014-11-06 DIAGNOSIS — R188 Other ascites: Secondary | ICD-10-CM

## 2014-11-06 DIAGNOSIS — C786 Secondary malignant neoplasm of retroperitoneum and peritoneum: Secondary | ICD-10-CM

## 2014-11-06 DIAGNOSIS — I82409 Acute embolism and thrombosis of unspecified deep veins of unspecified lower extremity: Secondary | ICD-10-CM

## 2014-11-06 DIAGNOSIS — Z5111 Encounter for antineoplastic chemotherapy: Secondary | ICD-10-CM

## 2014-11-06 LAB — CBC WITH DIFFERENTIAL/PLATELET
BASO%: 0.4 % (ref 0.0–2.0)
BASOS ABS: 0 10*3/uL (ref 0.0–0.1)
EOS%: 0.4 % (ref 0.0–7.0)
Eosinophils Absolute: 0 10*3/uL (ref 0.0–0.5)
HCT: 42 % (ref 34.8–46.6)
HEMOGLOBIN: 13.6 g/dL (ref 11.6–15.9)
LYMPH%: 21.1 % (ref 14.0–49.7)
MCH: 30.2 pg (ref 25.1–34.0)
MCHC: 32.4 g/dL (ref 31.5–36.0)
MCV: 93.1 fL (ref 79.5–101.0)
MONO#: 0.7 10*3/uL (ref 0.1–0.9)
MONO%: 15.5 % — ABNORMAL HIGH (ref 0.0–14.0)
NEUT#: 2.9 10*3/uL (ref 1.5–6.5)
NEUT%: 62.6 % (ref 38.4–76.8)
Platelets: 177 10*3/uL (ref 145–400)
RBC: 4.51 10*6/uL (ref 3.70–5.45)
RDW: 16.6 % — ABNORMAL HIGH (ref 11.2–14.5)
WBC: 4.6 10*3/uL (ref 3.9–10.3)
lymph#: 1 10*3/uL (ref 0.9–3.3)

## 2014-11-06 LAB — COMPREHENSIVE METABOLIC PANEL (CC13)
ALBUMIN: 3.1 g/dL — AB (ref 3.5–5.0)
ALT: 14 U/L (ref 0–55)
ANION GAP: 13 meq/L — AB (ref 3–11)
AST: 27 U/L (ref 5–34)
Alkaline Phosphatase: 151 U/L — ABNORMAL HIGH (ref 40–150)
BILIRUBIN TOTAL: 0.72 mg/dL (ref 0.20–1.20)
BUN: 4.5 mg/dL — AB (ref 7.0–26.0)
CO2: 26 mEq/L (ref 22–29)
Calcium: 9.3 mg/dL (ref 8.4–10.4)
Chloride: 102 mEq/L (ref 98–109)
Creatinine: 0.7 mg/dL (ref 0.6–1.1)
EGFR: 88 mL/min/{1.73_m2} — ABNORMAL LOW (ref >90)
GLUCOSE: 141 mg/dL — AB (ref 70–140)
POTASSIUM: 4.1 meq/L (ref 3.5–5.1)
Sodium: 141 mEq/L (ref 136–145)
TOTAL PROTEIN: 6.4 g/dL (ref 6.4–8.3)

## 2014-11-06 LAB — CANCER ANTIGEN 19-9: CA 19-9: 10391.6 U/mL — ABNORMAL HIGH (ref <35.0)

## 2014-11-06 MED ORDER — HEPARIN SOD (PORK) LOCK FLUSH 100 UNIT/ML IV SOLN
500.0000 [IU] | Freq: Once | INTRAVENOUS | Status: AC | PRN
  Administered 2014-11-06: 500 [IU]
  Filled 2014-11-06: qty 5

## 2014-11-06 MED ORDER — DEXAMETHASONE SODIUM PHOSPHATE 10 MG/ML IJ SOLN
INTRAMUSCULAR | Status: AC
  Filled 2014-11-06: qty 1

## 2014-11-06 MED ORDER — NYSTATIN 100000 UNIT/ML MT SUSP: 5.0000 mL | Freq: Four times a day (QID) | OROMUCOSAL | Status: DC

## 2014-11-06 MED ORDER — HEPARIN SOD (PORK) LOCK FLUSH 100 UNIT/ML IV SOLN
250.0000 [IU] | Freq: Once | INTRAVENOUS | Status: DC | PRN
  Filled 2014-11-06: qty 5

## 2014-11-06 MED ORDER — PACLITAXEL PROTEIN-BOUND CHEMO INJECTION 100 MG
75.0000 mg/m2 | Freq: Once | INTRAVENOUS | Status: AC
  Administered 2014-11-06: 125 mg via INTRAVENOUS
  Filled 2014-11-06: qty 25

## 2014-11-06 MED ORDER — ONDANSETRON 8 MG/NS 50 ML IVPB
INTRAVENOUS | Status: AC
  Filled 2014-11-06: qty 8

## 2014-11-06 MED ORDER — SODIUM CHLORIDE 0.9 % IJ SOLN
10.0000 mL | INTRAMUSCULAR | Status: DC | PRN
  Administered 2014-11-06: 10 mL
  Filled 2014-11-06: qty 10

## 2014-11-06 MED ORDER — ONDANSETRON 8 MG/50ML IVPB (CHCC)
8.0000 mg | Freq: Once | INTRAVENOUS | Status: AC
  Administered 2014-11-06: 8 mg via INTRAVENOUS

## 2014-11-06 MED ORDER — OXYCODONE HCL 5 MG PO TABS: 5.0000 mg | ORAL_TABLET | ORAL | Status: AC | PRN

## 2014-11-06 MED ORDER — SODIUM CHLORIDE 0.9 % IV SOLN
Freq: Once | INTRAVENOUS | Status: AC
  Administered 2014-11-06: 10:00:00 via INTRAVENOUS

## 2014-11-06 MED ORDER — OXYCODONE HCL 5 MG PO TABS: 5.0000 mg | ORAL_TABLET | ORAL | Status: DC | PRN

## 2014-11-06 MED ORDER — DEXAMETHASONE SODIUM PHOSPHATE 10 MG/ML IJ SOLN
10.0000 mg | Freq: Once | INTRAMUSCULAR | Status: AC
  Administered 2014-11-06: 10 mg via INTRAVENOUS

## 2014-11-06 MED ORDER — SODIUM CHLORIDE 0.9 % IV SOLN
800.0000 mg/m2 | Freq: Once | INTRAVENOUS | Status: AC
  Administered 2014-11-06: 1368 mg via INTRAVENOUS
  Filled 2014-11-06: qty 35.98

## 2014-11-06 NOTE — Telephone Encounter (Signed)
Pt confirmed labs/ov per 02/02 POF, gave pt AVS.... KJ, chemo already added... °

## 2014-11-06 NOTE — Patient Instructions (Signed)
[facility] Discharge Instructions for Patients Receiving Chemotherapy  Today you received the following chemotherapy agents Abraxane and Gemzar  To help prevent nausea and vomiting after your treatment, we encourage you to take your nausea medication as prescribed   If you develop nausea and vomiting that is not controlled by your nausea medication, call the clinic.   BELOW ARE SYMPTOMS THAT SHOULD BE REPORTED IMMEDIATELY:  *FEVER GREATER THAN 100.5 F  *CHILLS WITH OR WITHOUT FEVER  NAUSEA AND VOMITING THAT IS NOT CONTROLLED WITH YOUR NAUSEA MEDICATION  *UNUSUAL SHORTNESS OF BREATH  *UNUSUAL BRUISING OR BLEEDING  TENDERNESS IN MOUTH AND THROAT WITH OR WITHOUT PRESENCE OF ULCERS  *URINARY PROBLEMS  *BOWEL PROBLEMS  UNUSUAL RASH Items with * indicate a potential emergency and should be followed up as soon as possible.  Feel free to call the clinic you have any questions or concerns. The clinic phone number is [phone].

## 2014-11-06 NOTE — Progress Notes (Signed)
Nutrition follow-up completed with patient during chemotherapy for Pancreas cancer. Patient states pain is now control. Patient reports nausea has improved. Patient has been diagnosed with thrush and is receiving medications. Patient reports severe taste alterations secondary to thrush diagnosis. Weight was documented as 142.2 pounds February 2 increased from 135.7 pounds January 12. Patient does have lower extremity edema which may account for weight increase.  Nutrition diagnosis: Unintended weight loss improved.  Intervention: Patient educated to continue strategies for controlling pain and nausea. Enforced importance small frequent meals and snacks. Stressed importance of high protein diet to improve edema. Patient will continue oral nutrition supplements as needed. Teach back method used.  Monitoring, evaluation, goals: Patient will work to increase protein and calories for maintenance of lean body mass.  Next visit: Tuesday, February 9, during chemotherapy.  **Disclaimer: This note was dictated with voice recognition software. Similar sounding words can inadvertently be transcribed and this note may contain transcription errors which may not have been corrected upon publication of note.**

## 2014-11-06 NOTE — Patient Instructions (Signed)
Keep the scheduled appointment for the ultrasound-guided paracentesis for possible removal of ascites Follow-up in one week as scheduled

## 2014-11-06 NOTE — Progress Notes (Signed)
Hematology and Oncology Follow Up Visit  Christine Gould [id number] 1956/10/02 59 y.o. 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 This was diagnosed in February of 2015. Her CA 19-9 was 37,431.  Prior Therapy: She is status post endoscopic ultrasound and a biopsy completed on 11/07/2013 confirmed the presence of adenocarcinoma.  Current therapy: She is receiving palliative chemotherapy in the form of Gemzar and Abraxane given on day 1, day 8 of 21 day cycle. Status post 7 cycles completed on 04/03/2014. She resumed this same treatment on 07/10/2014.   Secondary diagnosis: She developed left deep vein thrombosis on 12/12/2013 she was treated with Lovenox for 6 months.  Interim History:  Christine Gould presents today for a followup visit with her husband. Since the last visit, she is doing rather poorly. She is reporting diffuse abdominal pain with increase in her abdominal fullness and pressure. Her pain is predominantly epigastric and left upper quadrant, although now tends to be left upper quadrant rib cage extending over to the right upper quadrant rib cage area. She is feeling somewhat bloated. Her by mouth intake has decreased. Her pain medication use has increased due to her abdominal discomfort/pain.. She reports some nausea that is well-controlled with her current antiemetics. She continues to have her baseline level of neuropathy which has not progressed. She continues to ambulate without any major difficulty using a cane.  She denies falls or syncope. She still has a reasonable quality of life. She denies hematochezia or melena.She reports feeling "tired".  She denied any alteration of mental status with confusion. She denies any other complications. She has not reported any dizziness or seizure activity. Did  not report any lymphadenopathy or petechiae. Did not report any skin rashes or lesions.  She denied any skeletal complaints or pains. Has not reported any mood disturbance. Her pain continues to be under reasonable control. She requests a refill for her pain medication. Remainder of her review of systems unremarkable.  Medications: I have reviewed the patient's current medications.  Current Outpatient Prescriptions  Medication Sig Dispense Refill  . acetaminophen (TYLENOL) 500 MG tablet Take 1,000 mg by mouth every 6 (six) hours as needed for mild pain or moderate pain.    Marland Kitchen ALPRAZolam (XANAX) 0.5 MG tablet Take 0.5 mg by mouth 3 (three) times daily as needed.     Marland Kitchen amLODipine (NORVASC) 10 MG tablet Take 10 mg by mouth every morning.    . citalopram (CELEXA) 40 MG tablet   3  . ibuprofen (ADVIL,MOTRIN) 200 MG tablet Take 400 mg by mouth every 6 (six) hours as needed for mild pain or moderate pain.    Marland Kitchen lidocaine-prilocaine (EMLA) cream Apply 1 application topically daily as needed (port).    . magnesium oxide (MAG-OX) 400 (241.3 MG) MG tablet Take 1 tablet (400 mg total) by mouth 2 (two) times daily. 30 tablet 0  . metFORMIN (GLUCOPHAGE) 1000 MG tablet Take 1,000 mg by mouth 2 (two) times daily with a meal.    . ondansetron (ZOFRAN) 8 MG tablet Take 1 tablet (8 mg total) by mouth every 8 (eight) hours as needed for nausea or vomiting. 20 tablet 0  . oxyCODONE (OXY IR/ROXICODONE) 5 MG immediate release tablet Take 1 tablet (5 mg total) by mouth every 4 (four) hours as needed for severe pain. 30 tablet 0  . potassium  chloride SA (K-DUR,KLOR-CON) 20 MEQ tablet Take 2 tablets (40 mEq total) by mouth once. 18 tablet 0  . PRESCRIPTION MEDICATION Chemotherapy regimen    . prochlorperazine (COMPAZINE) 10 MG tablet TAKE 1 TABLET BY MOUTH EVERY 6 HOURS AS NEEDED FOR NAUSEA OR VOMITING 30 tablet 1  . PROCTOZONE-HC 2.5 % rectal cream Apply 1 application topically as needed.  0  . traZODone (DESYREL) 100 MG  tablet Take 100 mg by mouth at bedtime.    Marland Kitchen nystatin (MYCOSTATIN) 100000 UNIT/ML suspension Take 5 mLs (500,000 Units total) by mouth 4 (four) times daily. 120 mL 1   No current facility-administered medications for this visit.     Allergies:  Allergies  Allergen Reactions  . Statins Other (See Comments)    Cramps-fatigue  . Toprol Xl [Metoprolol] Other (See Comments)    Pain, cramps, fatigue     Past Medical History, Surgical history, Social history, and Family History were reviewed and updated.   Physical Exam: Blood pressure 139/87, pulse 106, temperature 98 F (36.7 C), temperature source Oral, resp. rate 18, height 5\' 5"  (1.651 m), weight 142 lb 3.2 oz (64.501 kg).  ECOG: 1 General appearance: alert awake not in any distress. Head: Normocephalic, without obvious abnormality Neck: no adenopathy Lymph nodes: Cervical, supraclavicular, and axillary nodes normal. Heart:regular rate and rhythm, S1, S2. Lung:chest clear, no wheezing, no dullness to percussion to Abdomin:  No tenderness on deep palpation. Increased abdominal fullness with generalized tense abdomen with questionable shifting dullness  EXT: 1+ bilaterally unchanged from previous examination.  Neurological examination: No deficits noted on today's exam Mouth: Reveals moderate thrush.  Lab Results: Lab Results  Component Value Date   WBC 4.6 11/06/2014   HGB 13.6 11/06/2014   HCT 42.0 11/06/2014   MCV 93.1 11/06/2014   PLT 177 11/06/2014     Chemistry      Component Value Date/Time   NA 141 11/06/2014 0838   NA 135* 04/08/2014 1130   K 4.1 11/06/2014 0838   K 3.4* 04/08/2014 1130   CL 96 04/08/2014 1130   CO2 26 11/06/2014 0838   CO2 23 04/08/2014 1130   BUN 4.5* 11/06/2014 0838   BUN 14 04/08/2014 1130   CREATININE 0.7 11/06/2014 0838   CREATININE 0.52 04/08/2014 1130      Component Value Date/Time   CALCIUM 9.3 11/06/2014 0838   CALCIUM 8.7 04/08/2014 1130   ALKPHOS 151* 11/06/2014 0838    ALKPHOS 82 04/08/2014 1130   AST 27 11/06/2014 0838   AST 33 04/08/2014 1130   ALT 14 11/06/2014 0838   ALT 58* 04/08/2014 1130   BILITOT 0.72 11/06/2014 0838   BILITOT 0.3 04/08/2014 1130     Impression and Plan:   59 year old woman with the following issues:   1. 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 She is S/P palliative chemotherapy in the form of Gemzar and Abraxane.  CT scan from 04/17/2014  showed a dramatic response to systemic chemotherapy with very little residual cancer.  Followup CT scan on 07/05/2014  Showed  progression of disease. Given these new findings, she resumed systemic chemotherapy with Gemzar and Abraxane.  Chemotherapy is given on day 1, day 8 of a 21 day cycle.   CT scan results on 09/17/2014  showed overall response to systemic chemotherapy. Chemotherapy was delayed due to neutropenia and the last cycle. The plan is to resume chemotherapy with 25% dose reduction due  to neutropenia. She received day 1 of cycle #6 today as scheduled. Of note the CT scan of 09/17/2014 revealed small to moderate amount of ascites. Given the increased abdominal fullness/tenseness I suspect she may have accumulated more fluid. We will arrange an ultrasound guided therapeutic paracentesis on 11/09/2014.  2. Abdominal/epigastric pain: She is currently utilizing oxycodone with good relief. She was encouraged  to schedule oxycodone around-the-clock 3 times a day and certainly we can use long-acting medication if this fails. She was given a refill of her oxycodone tablets today.  3. IV access: She has a Port-A-Cath in place.this will be utilized for systemic chemotherapy.  4. Nausea prophylaxis: She has antiemetics prescribed.   5.Thrombosis: Of the left lower extremity. She is off Lovenox at this time.  6. Constipation: Likely related to systemic chemotherapy as well as  narcotic analgesics. This is improved at this time.  7. Thrombocytopenia: Her platelet count is normal today and will resume chemotherapy as scheduled. Her chemotherapy doses been reduced by 25%.  8. Oral candidiasis: Prescription for nystatin suspension was sent to her pharmacy of record to address the oral candidiasis  9. Followup: In one week for day 8 of this cycle of chemotherapy.     Carlton Adam, PA-C  2/2/20162:34 PM

## 2014-11-09 ENCOUNTER — Ambulatory Visit (HOSPITAL_COMMUNITY)
Admission: RE | Admit: 2014-11-09 | Discharge: 2014-11-09 | Disposition: A | Discharge status: Home / Self Care | Payer: PRIVATE HEALTH INSURANCE | Source: Ambulatory Visit | Attending: Physician Assistant | Admitting: Physician Assistant

## 2014-11-09 DIAGNOSIS — R188 Other ascites: Secondary | ICD-10-CM | POA: Diagnosis not present

## 2014-11-09 DIAGNOSIS — C252 Malignant neoplasm of tail of pancreas: Secondary | ICD-10-CM | POA: Diagnosis not present

## 2014-11-09 NOTE — Procedures (Signed)
US guided therapeutic paracentesis performed yielding 3.3 liters yellow fluid. No immediate complications.

## 2014-11-13 ENCOUNTER — Other Ambulatory Visit (HOSPITAL_BASED_OUTPATIENT_CLINIC_OR_DEPARTMENT_OTHER): Payer: PRIVATE HEALTH INSURANCE

## 2014-11-13 ENCOUNTER — Ambulatory Visit: Payer: PRIVATE HEALTH INSURANCE

## 2014-11-13 ENCOUNTER — Ambulatory Visit (HOSPITAL_BASED_OUTPATIENT_CLINIC_OR_DEPARTMENT_OTHER): Payer: PRIVATE HEALTH INSURANCE | Admitting: Oncology

## 2014-11-13 ENCOUNTER — Ambulatory Visit: Payer: PRIVATE HEALTH INSURANCE | Admitting: Oncology

## 2014-11-13 ENCOUNTER — Telehealth: Payer: Self-pay | Admitting: *Deleted

## 2014-11-13 ENCOUNTER — Telehealth: Payer: Self-pay | Admitting: Oncology

## 2014-11-13 ENCOUNTER — Encounter: Payer: PRIVATE HEALTH INSURANCE | Admitting: Nutrition

## 2014-11-13 VITALS — BP 113/79 | HR 101 | Temp 97.5°F | Resp 18 | Ht 65.0 in | Wt 135.7 lb

## 2014-11-13 DIAGNOSIS — K5900 Constipation, unspecified: Secondary | ICD-10-CM

## 2014-11-13 DIAGNOSIS — C259 Malignant neoplasm of pancreas, unspecified: Secondary | ICD-10-CM

## 2014-11-13 DIAGNOSIS — B370 Candidal stomatitis: Secondary | ICD-10-CM

## 2014-11-13 DIAGNOSIS — R1013 Epigastric pain: Secondary | ICD-10-CM

## 2014-11-13 DIAGNOSIS — C786 Secondary malignant neoplasm of retroperitoneum and peritoneum: Secondary | ICD-10-CM

## 2014-11-13 DIAGNOSIS — D696 Thrombocytopenia, unspecified: Secondary | ICD-10-CM

## 2014-11-13 DIAGNOSIS — C252 Malignant neoplasm of tail of pancreas: Secondary | ICD-10-CM

## 2014-11-13 DIAGNOSIS — C787 Secondary malignant neoplasm of liver and intrahepatic bile duct: Secondary | ICD-10-CM

## 2014-11-13 DIAGNOSIS — I82409 Acute embolism and thrombosis of unspecified deep veins of unspecified lower extremity: Secondary | ICD-10-CM

## 2014-11-13 LAB — COMPREHENSIVE METABOLIC PANEL (CC13)
ALBUMIN: 2.6 g/dL — AB (ref 3.5–5.0)
ALT: 16 U/L (ref 0–55)
ANION GAP: 13 meq/L — AB (ref 3–11)
AST: 25 U/L (ref 5–34)
Alkaline Phosphatase: 127 U/L (ref 40–150)
BUN: 3.2 mg/dL — ABNORMAL LOW (ref 7.0–26.0)
CO2: 25 mEq/L (ref 22–29)
Calcium: 8.7 mg/dL (ref 8.4–10.4)
Chloride: 101 mEq/L (ref 98–109)
Creatinine: 0.7 mg/dL (ref 0.6–1.1)
EGFR: >90 mL/min/{1.73_m2} (ref >90)
Glucose: 159 mg/dl — ABNORMAL HIGH (ref 70–140)
Potassium: 3.1 mEq/L — ABNORMAL LOW (ref 3.5–5.1)
SODIUM: 139 meq/L (ref 136–145)
TOTAL PROTEIN: 6.2 g/dL — AB (ref 6.4–8.3)
Total Bilirubin: 0.9 mg/dL (ref 0.20–1.20)

## 2014-11-13 LAB — CBC WITH DIFFERENTIAL/PLATELET
BASO%: 0.2 % (ref 0.0–2.0)
Basophils Absolute: 0 10*3/uL (ref 0.0–0.1)
EOS%: 0.2 % (ref 0.0–7.0)
Eosinophils Absolute: 0 10*3/uL (ref 0.0–0.5)
HCT: 36.3 % (ref 34.8–46.6)
HEMOGLOBIN: 12.2 g/dL (ref 11.6–15.9)
LYMPH#: 1.2 10*3/uL (ref 0.9–3.3)
LYMPH%: 20 % (ref 14.0–49.7)
MCH: 30.3 pg (ref 25.1–34.0)
MCHC: 33.6 g/dL (ref 31.5–36.0)
MCV: 90.3 fL (ref 79.5–101.0)
MONO#: 0.7 10*3/uL (ref 0.1–0.9)
MONO%: 11.3 % (ref 0.0–14.0)
NEUT%: 68.3 % (ref 38.4–76.8)
NEUTROS ABS: 4.2 10*3/uL (ref 1.5–6.5)
Platelets: 49 10*3/uL — ABNORMAL LOW (ref 145–400)
RBC: 4.02 10*6/uL (ref 3.70–5.45)
RDW: 16 % — AB (ref 11.2–14.5)
WBC: 6.1 10*3/uL (ref 3.9–10.3)

## 2014-11-13 MED ORDER — FENTANYL 25 MCG/HR TD PT72: 25.0000 ug | MEDICATED_PATCH | TRANSDERMAL | Status: AC

## 2014-11-13 NOTE — Telephone Encounter (Signed)
Spoke with yvette at hospice. Per dr Alen Blew he will be attending, hospice physicians may do symptom management, have dnr signed. Patient given hospice pamphlet and pre-printed sheets explaining services and frequently asked questions. Chemotherapy cancelled.

## 2014-11-13 NOTE — Progress Notes (Signed)
Hematology and Oncology Follow Up Visit  Christine Gould [id number] 1956/07/17 59 y.o. 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 This was diagnosed in February of 2015. Her CA 19-9 was 37,431.  Prior Therapy: She is status post endoscopic ultrasound and a biopsy completed on 11/07/2013 confirmed the presence of adenocarcinoma.  Current therapy: She is receiving palliative chemotherapy in the form of Gemzar and Abraxane given on day 1, day 8 of 21 day cycle. Status post 7 cycles completed on 04/03/2014. She resumed this same treatment on 07/10/2014.   Secondary diagnosis: She developed left deep vein thrombosis on 12/12/2013 she was treated with Lovenox for 6 months.  Interim History:  Christine Gould presents today for a followup visit with her husband. Since the last visit, she is reporting more decline. She have reported abdominal ascites that was drained successfully last week with removal of 3.3 L. She felt much better after that. She continues to have diffuse pain and takes oxycodone every 4 hours with some relief. Her by mouth intake is declining daily. She continues to lose weight and her performance status have been very limited. She reports some nausea that is well-controlled with her current antiemetics. She denies hematochezia or melena.She reports feeling "tired".  She denied any alteration of mental status with confusion. She denies any other complications. She has not reported any dizziness or seizure activity. Did not report any lymphadenopathy or petechiae. Did not report any skin rashes or lesions.  She denied any skeletal complaints or pains. Has not reported any mood disturbance. Her pain continues to be under reasonable control. She requests a refill for her pain medication. Remainder of her review  of systems unremarkable.  Medications: I have reviewed the patient's current medications.  Current Outpatient Prescriptions  Medication Sig Dispense Refill  . acetaminophen (TYLENOL) 500 MG tablet Take 1,000 mg by mouth every 6 (six) hours as needed for mild pain or moderate pain.    Marland Kitchen ALPRAZolam (XANAX) 0.5 MG tablet Take 0.5 mg by mouth 3 (three) times daily as needed.     Marland Kitchen amLODipine (NORVASC) 10 MG tablet Take 10 mg by mouth every morning.    . citalopram (CELEXA) 40 MG tablet   3  . ibuprofen (ADVIL,MOTRIN) 200 MG tablet Take 400 mg by mouth every 6 (six) hours as needed for mild pain or moderate pain.    Marland Kitchen lidocaine-prilocaine (EMLA) cream Apply 1 application topically daily as needed (port).    . magnesium oxide (MAG-OX) 400 (241.3 MG) MG tablet Take 1 tablet (400 mg total) by mouth 2 (two) times daily. 30 tablet 0  . metFORMIN (GLUCOPHAGE) 1000 MG tablet Take 1,000 mg by mouth 2 (two) times daily with a meal.    . nystatin (MYCOSTATIN) 100000 UNIT/ML suspension Take 5 mLs (500,000 Units total) by mouth 4 (four) times daily. 120 mL 1  . ondansetron (ZOFRAN) 8 MG tablet Take 1 tablet (8 mg total) by mouth every 8 (eight) hours as needed for nausea or vomiting. 20 tablet 0  . oxyCODONE (OXY IR/ROXICODONE) 5 MG immediate release tablet Take 1 tablet (5 mg total) by mouth every 4 (four) hours as needed for severe pain. 30 tablet 0  . potassium chloride SA (K-DUR,KLOR-CON) 20 MEQ tablet Take 2 tablets (40 mEq total) by mouth once. 18 tablet 0  . PRESCRIPTION MEDICATION Chemotherapy regimen    .  prochlorperazine (COMPAZINE) 10 MG tablet TAKE 1 TABLET BY MOUTH EVERY 6 HOURS AS NEEDED FOR NAUSEA OR VOMITING 30 tablet 1  . PROCTOZONE-HC 2.5 % rectal cream Apply 1 application topically as needed.  0  . traZODone (DESYREL) 100 MG tablet Take 100 mg by mouth at bedtime.    . fentaNYL (DURAGESIC - DOSED MCG/HR) 25 MCG/HR patch Place 1 patch (25 mcg total) onto the skin every 3 (three) days. 10 patch  0   No current facility-administered medications for this visit.     Allergies:  Allergies  Allergen Reactions  . Statins Other (See Comments)    Cramps-fatigue  . Toprol Xl [Metoprolol] Other (See Comments)    Pain, cramps, fatigue     Past Medical History, Surgical history, Social history, and Family History were reviewed and updated.   Physical Exam: Blood pressure 113/79, pulse 101, temperature 97.5 F (36.4 C), temperature source Oral, resp. rate 18, height 5\' 5"  (1.651 m), weight 135 lb 11.2 oz (61.553 kg).  ECOG: 2 General appearance: alert awake not in any distress. Head: Normocephalic, without obvious abnormality Neck: no adenopathy Lymph nodes: Cervical, supraclavicular, and axillary nodes normal. Heart:regular rate and rhythm, S1, S2. Lung:chest clear, no wheezing, no dullness to percussion to Abdomin:  No tenderness on deep palpation. Abdominal distention resolved with very little ascites. EXT: 1+ bilaterally unchanged from previous examination.  Neurological examination: No deficits noted on today's exam Mouth: Ritta Slot is resolved.  Lab Results: Lab Results  Component Value Date   WBC 6.1 11/13/2014   HGB 12.2 11/13/2014   HCT 36.3 11/13/2014   MCV 90.3 11/13/2014   PLT 49* 11/13/2014     Chemistry      Component Value Date/Time   NA 139 11/13/2014 0826   NA 135* 04/08/2014 1130   K 3.1* 11/13/2014 0826   K 3.4* 04/08/2014 1130   CL 96 04/08/2014 1130   CO2 25 11/13/2014 0826   CO2 23 04/08/2014 1130   BUN 3.2* 11/13/2014 0826   BUN 14 04/08/2014 1130   CREATININE 0.7 11/13/2014 0826   CREATININE 0.52 04/08/2014 1130      Component Value Date/Time   CALCIUM 8.7 11/13/2014 0826   CALCIUM 8.7 04/08/2014 1130   ALKPHOS 127 11/13/2014 0826   ALKPHOS 82 04/08/2014 1130   AST 25 11/13/2014 0826   AST 33 04/08/2014 1130   ALT 16 11/13/2014 0826   ALT 58* 04/08/2014 1130   BILITOT 0.90 11/13/2014 0826   BILITOT 0.3 04/08/2014 1130      Impression and Plan:   59 year old woman with the following issues:   1. 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 She is S/P palliative chemotherapy in the form of Gemzar and Abraxane.  CT scan from 04/17/2014  showed a dramatic response to systemic chemotherapy with very little residual cancer.  Followup CT scan on 07/05/2014  Showed  progression of disease. Given these new findings, she resumed systemic chemotherapy with Gemzar and Abraxane.  Chemotherapy is given on day 1, day 8 of a 21 day cycle.   Her platelet count is low today and she will not receive chemotherapy. Since the last few visits, I have noticed continuous decline in her overall health. Her performance status have deteriorated rapidly and her ability to handle chemotherapy is becoming more difficult. Her tumor marker have increased dramatically in the last few months indicating probably progression of disease. Options of treatments were  discussed today with the patient and her family including using different salvage chemotherapy after a CT scan versus hospice care. The today's discussion, the patient opted to proceed with supportive and hospice care only. We'll make the hospice referral as indicated today. We will suspend any further chemotherapy moving forward.  2. Abdominal/epigastric pain: She is currently utilizing oxycodone with good relief. I gave her a prescription for a fentanyl patch for long-acting medication and encouraged her to use the oxycodone as needed.  3. IV access: She has a Port-A-Cath in place.this will be flushed as needed.  4. Nausea prophylaxis: She has antiemetics prescribed.   5.Thrombosis: Of the left lower extremity. She is off Lovenox at this time.  6. Constipation: Likely related to systemic chemotherapy as well as narcotic analgesics. This is improved at this time.  7.  Thrombocytopenia: Her platelet count is low but no signs of bleeding. All chemotherapy been suspended.  8. Oral candidiasis: Seems to have improved.  9. Followup: In one month to check on her status and I told her that she can certainly skip that appointment if she is not feeling up to it and will rely on hospice for updates.     [facility], MD 2/9/20169:07 AM

## 2014-11-13 NOTE — Telephone Encounter (Signed)
gv and printed appt sched adn avs for pt for March.... °

## 2014-12-07 ENCOUNTER — Other Ambulatory Visit: Payer: Self-pay | Admitting: *Deleted

## 2014-12-07 DIAGNOSIS — C772 Secondary and unspecified malignant neoplasm of intra-abdominal lymph nodes: Secondary | ICD-10-CM

## 2014-12-07 DIAGNOSIS — C259 Malignant neoplasm of pancreas, unspecified: Secondary | ICD-10-CM

## 2014-12-07 NOTE — Progress Notes (Signed)
Received voice message from [place], hospice nurse, stating,"patient is having increased abdominal ascites, right lower leg edema, SOB, and her right foot is swollen and is a 4+. Is there anyway to get a standing order for a paracentesis from Dr. Alen Blew? She had 3.3 liters removed 3 weeks ago. She is having family in over the weekend and I'm trying to make her comfortable as possible. " Per Dr. Alen Blew, she can have a standing order for a paracentesis. Orders placed and Radiology aware of orders. I spoke with Constance Holster, RN, and she was appreciative of the order and knows to call Radiology and schedule the appointment.

## 2014-12-10 ENCOUNTER — Ambulatory Visit (HOSPITAL_COMMUNITY)
Admission: RE | Admit: 2014-12-10 | Discharge: 2014-12-10 | Disposition: A | Discharge status: Home / Self Care | Payer: PRIVATE HEALTH INSURANCE | Source: Ambulatory Visit | Attending: Oncology | Admitting: Oncology

## 2014-12-10 DIAGNOSIS — R188 Other ascites: Secondary | ICD-10-CM | POA: Insufficient documentation

## 2014-12-10 DIAGNOSIS — C772 Secondary and unspecified malignant neoplasm of intra-abdominal lymph nodes: Secondary | ICD-10-CM

## 2014-12-10 DIAGNOSIS — C259 Malignant neoplasm of pancreas, unspecified: Secondary | ICD-10-CM | POA: Insufficient documentation

## 2014-12-10 NOTE — Procedures (Signed)
Successful US guided paracentesis from RLQ.  Yielded 6.4 liters of serous fluid.  No immediate complications.  Pt tolerated well.   Specimen was not sent for labs.  Tsosie Billing D PA-C 12/10/2014 12:04 PM

## 2014-12-11 ENCOUNTER — Encounter: Payer: Self-pay | Admitting: Physician Assistant

## 2014-12-11 ENCOUNTER — Ambulatory Visit (HOSPITAL_BASED_OUTPATIENT_CLINIC_OR_DEPARTMENT_OTHER): Payer: PRIVATE HEALTH INSURANCE | Admitting: Physician Assistant

## 2014-12-11 VITALS — BP 117/81 | HR 118 | Temp 98.1°F | Resp 18 | Ht 65.0 in | Wt 129.7 lb

## 2014-12-11 DIAGNOSIS — E876 Hypokalemia: Secondary | ICD-10-CM

## 2014-12-11 DIAGNOSIS — B370 Candidal stomatitis: Secondary | ICD-10-CM

## 2014-12-11 DIAGNOSIS — K5900 Constipation, unspecified: Secondary | ICD-10-CM

## 2014-12-11 DIAGNOSIS — R1013 Epigastric pain: Secondary | ICD-10-CM

## 2014-12-11 DIAGNOSIS — C252 Malignant neoplasm of tail of pancreas: Secondary | ICD-10-CM

## 2014-12-11 DIAGNOSIS — D696 Thrombocytopenia, unspecified: Secondary | ICD-10-CM

## 2014-12-11 DIAGNOSIS — R188 Other ascites: Secondary | ICD-10-CM

## 2014-12-11 DIAGNOSIS — Z86718 Personal history of other venous thrombosis and embolism: Secondary | ICD-10-CM

## 2014-12-11 MED ORDER — POTASSIUM CHLORIDE CRYS ER 20 MEQ PO TBCR: 20.0000 meq | EXTENDED_RELEASE_TABLET | Freq: Two times a day (BID) | ORAL | Status: AC

## 2014-12-11 MED ORDER — NYSTATIN 100000 UNIT/ML MT SUSP: 5.0000 mL | Freq: Four times a day (QID) | OROMUCOSAL | Status: AC

## 2014-12-11 MED ORDER — ONDANSETRON HCL 8 MG PO TABS: 8.0000 mg | ORAL_TABLET | Freq: Three times a day (TID) | ORAL | Status: AC | PRN

## 2014-12-11 NOTE — Progress Notes (Signed)
Hematology and Oncology Follow Up Visit  Christine Gould [id number] Mar 09, 1956 59 y.o. 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 This was diagnosed in February of 2015. Her CA 19-9 was 37,431.  Prior Therapy: She is status post endoscopic ultrasound and a biopsy completed on 11/07/2013 confirmed the presence of adenocarcinoma.  Current therapy: She is receiving palliative chemotherapy in the form of Gemzar and Abraxane given on day 1, day 8 of 21 day cycle. Status post 7 cycles completed on 04/03/2014. She resumed this same treatment on 07/10/2014. Discontinued 11/13/2014 secondary to disease progression  Secondary diagnosis: She developed left deep vein thrombosis on 12/12/2013 she was treated with Lovenox for 6 months.  Interim History:  Christine Gould presents today for a followup visit with her husband. Since the last visit, she is reporting more decline. She again developed significant abdominal ascites and underwent paracentesis on 12/10/2014 with removal of 6.4 L. She tolerated the procedure without difficulty and feels significantly better.  She continues to have diffuse pain and takes oxycodone every 4 hours with some relief. Her by mouth intake is declining daily. She continues to lose weight and her performance status have been very limited. She reports some nausea that is non-is well-controlled with her current antibiotic.  She denies hematochezia or melena.She reports feeling "tired".  She denied any alteration of mental status with confusion. She denies any other complications. She has not reported any dizziness or seizure activity. Did not report any lymphadenopathy or petechiae. Did not report any skin rashes or lesions.  She denied any skeletal complaints or pains. Has not reported any mood  disturbance. Her pain continues to be under reasonable control. She is currently under hospice care. Remainder of her review of systems unremarkable.  Medications: I have reviewed the patient's current medications.  Current Outpatient Prescriptions  Medication Sig Dispense Refill  . acetaminophen (TYLENOL) 500 MG tablet Take 1,000 mg by mouth every 6 (six) hours as needed for mild pain or moderate pain.    Marland Kitchen ALPRAZolam (XANAX) 0.5 MG tablet Take 0.5 mg by mouth 3 (three) times daily as needed.     Marland Kitchen amLODipine (NORVASC) 10 MG tablet Take 10 mg by mouth every morning.    . citalopram (CELEXA) 40 MG tablet   3  . fentaNYL (DURAGESIC - DOSED MCG/HR) 25 MCG/HR patch Place 1 patch (25 mcg total) onto the skin every 3 (three) days. 10 patch 0  . ibuprofen (ADVIL,MOTRIN) 200 MG tablet Take 400 mg by mouth every 6 (six) hours as needed for mild pain or moderate pain.    Marland Kitchen lidocaine-prilocaine (EMLA) cream Apply 1 application topically daily as needed (port).    . magnesium oxide (MAG-OX) 400 (241.3 MG) MG tablet Take 1 tablet (400 mg total) by mouth 2 (two) times daily. 30 tablet 0  . metFORMIN (GLUCOPHAGE) 1000 MG tablet Take 1,000 mg by mouth 2 (two) times daily with a meal.    . nystatin (MYCOSTATIN) 100000 UNIT/ML suspension Take 5 mLs (500,000 Units total) by mouth 4 (four) times daily. 120 mL 1  . ondansetron (ZOFRAN) 8 MG tablet Take 1 tablet (8 mg total) by mouth every 8 (eight) hours as needed for nausea or vomiting. 20 tablet 0  . oxyCODONE (OXY IR/ROXICODONE) 5 MG immediate release tablet Take 1 tablet (5 mg total) by mouth every 4 (four)  hours as needed for severe pain. 30 tablet 0  . PRESCRIPTION MEDICATION Chemotherapy regimen    . prochlorperazine (COMPAZINE) 10 MG tablet TAKE 1 TABLET BY MOUTH EVERY 6 HOURS AS NEEDED FOR NAUSEA OR VOMITING 30 tablet 1  . PROCTOZONE-HC 2.5 % rectal cream Apply 1 application topically as needed.  0  . traZODone (DESYREL) 100 MG tablet Take 100 mg by mouth  at bedtime.    . ondansetron (ZOFRAN) 8 MG tablet Take 1 tablet (8 mg total) by mouth every 8 (eight) hours as needed for nausea or vomiting. 20 tablet 2  . potassium chloride SA (K-DUR,KLOR-CON) 20 MEQ tablet Take 2 tablets (40 mEq total) by mouth once. (Patient not taking: Reported on 12/11/2014) 18 tablet 0  . potassium chloride SA (K-DUR,KLOR-CON) 20 MEQ tablet Take 1 tablet (20 mEq total) by mouth 2 (two) times daily. 7 tablet 0   No current facility-administered medications for this visit.     Allergies:  Allergies  Allergen Reactions  . Statins Other (See Comments)    Cramps-fatigue  . Toprol Xl [Metoprolol] Other (See Comments)    Pain, cramps, fatigue     Past Medical History, Surgical history, Social history, and Family History were reviewed and updated.   Physical Exam: Blood pressure 117/81, pulse 118, temperature 98.1 F (36.7 C), temperature source Oral, resp. rate 18, height 5\' 5"  (1.651 m), weight 129 lb 11.2 oz (58.832 kg), SpO2 100 %.  ECOG: 2 General appearance: alert awake not in any distress. Head: Normocephalic, without obvious abnormality Neck: no adenopathy Lymph nodes: Cervical, supraclavicular, and axillary nodes normal. Heart:regular rate and rhythm, S1, S2. Lung:chest clear, no wheezing, no dullness to percussion to Abdomin:  No tenderness on deep palpation. Abdominal distention resolved with very little ascites. EXT: 1+ bilaterally unchanged from previous examination.  Neurological examination: No deficits noted on today's exam Mouth: Mild/moderate thrush on the tongue, minimally on the buccal mucosa.  Lab Results: Lab Results  Component Value Date   WBC 6.1 11/13/2014   HGB 12.2 11/13/2014   HCT 36.3 11/13/2014   MCV 90.3 11/13/2014   PLT 49* 11/13/2014     Chemistry      Component Value Date/Time   NA 139 11/13/2014 0826   NA 135* 04/08/2014 1130   K 3.1* 11/13/2014 0826   K 3.4* 04/08/2014 1130   CL 96 04/08/2014 1130   CO2 25  11/13/2014 0826   CO2 23 04/08/2014 1130   BUN 3.2* 11/13/2014 0826   BUN 14 04/08/2014 1130   CREATININE 0.7 11/13/2014 0826   CREATININE 0.52 04/08/2014 1130      Component Value Date/Time   CALCIUM 8.7 11/13/2014 0826   CALCIUM 8.7 04/08/2014 1130   ALKPHOS 127 11/13/2014 0826   ALKPHOS 82 04/08/2014 1130   AST 25 11/13/2014 0826   AST 33 04/08/2014 1130   ALT 16 11/13/2014 0826   ALT 58* 04/08/2014 1130   BILITOT 0.90 11/13/2014 0826   BILITOT 0.3 04/08/2014 1130     Impression and Plan:   59 year old woman with the following issues:   1. 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 She is S/P palliative chemotherapy in the form of Gemzar and Abraxane.  CT scan from 04/17/2014  showed a dramatic response to systemic chemotherapy with very little residual cancer.  Followup CT scan on 07/05/2014  Showed  progression of disease. Given these new findings, she resumed systemic chemotherapy  with Gemzar and Abraxane.  Chemotherapy is given on day 1, day 8 of a 21 day cycle.   Her platelet count is low again today. She is not having any active bleeding or bruising. Chemotherapy has been discontinued and the patient is currently on hospice care. We will continue to provide comfort/symptomatic care.   2. Abdominal/epigastric pain: She is currently utilizing oxycodone with good relief. Continue current pain management with fentanyl patches and oxycodone.  3. IV access: She has a Port-A-Cath in place.this will be flushed as needed.  4. Nausea prophylaxis: She currently has Compazine at home, I sent a prescription for Zofran to her pharmacy of record via E scribed.   5.Thrombosis: Of the left lower extremity. She is off Lovenox at this time.  6. Constipation: Likely related to systemic chemotherapy as well as narcotic analgesics. This is improved at this time.  7.  Thrombocytopenia: Her platelet count is low but no signs of bleeding. All chemotherapy been suspended.  8. Oral candidiasis: Prescription for nystatin suspension is been sent to her pharmacy of record to address the oral candidiasis.   9. Hypokalemia: A prescription for potassium chloride 20 mEq by mouth for the next 7 days was sent her pharmacy of record via E scribe.  10. Followup: On an as-needed basis. Patient currently under hospice care will also provide [place] with updates.      Carlton Adam, PA-C  3/8/201610:42 AM

## 2014-12-11 NOTE — Patient Instructions (Signed)
Continue with hospice care and follow-up with [place] on an as-needed basis

## 2015-01-07 ENCOUNTER — Other Ambulatory Visit: Payer: Self-pay | Admitting: *Deleted

## 2015-01-07 ENCOUNTER — Ambulatory Visit (HOSPITAL_COMMUNITY)
Admission: RE | Admit: 2015-01-07 | Discharge: 2015-01-07 | Disposition: A | Discharge status: Home / Self Care | Payer: PRIVATE HEALTH INSURANCE | Source: Ambulatory Visit | Attending: Physician Assistant | Admitting: Physician Assistant

## 2015-01-07 DIAGNOSIS — C259 Malignant neoplasm of pancreas, unspecified: Secondary | ICD-10-CM

## 2015-01-07 DIAGNOSIS — R188 Other ascites: Secondary | ICD-10-CM | POA: Insufficient documentation

## 2015-01-07 NOTE — Procedures (Signed)
Ultrasound-guided therapeutic paracentesis performed yielding 6.7 L yellow fluid. No immediate complications.

## 2015-01-16 ENCOUNTER — Encounter: Payer: Self-pay | Admitting: *Deleted

## 2015-01-16 ENCOUNTER — Telehealth: Payer: Self-pay | Admitting: Oncology

## 2015-01-16 NOTE — Progress Notes (Signed)
rec'd note from [facility]. Patient expired Feb 05, 2015. No future appts in epic. Dr Alen Blew notified

## 2015-01-16 NOTE — Telephone Encounter (Signed)
RECEVIED DEATH CERTIFICATE 8/82/80

## 2015-01-17 ENCOUNTER — Telehealth: Payer: Self-pay | Admitting: *Deleted

## 2015-01-17 NOTE — Telephone Encounter (Signed)
TC from pt's husband.  He states the [employer] are waiting for signed death certificate. Per HIM the certificate has been given to Dr. Alen Blew. Checked with his nurse, Hamel. It has been signed and will be takne to medical records. Pt's husband notified per Nyoka Cowden, RN

## 2015-02-03 DEATH — deceased

## 2015-05-20 IMAGING — CT CT CHEST W/ CM
2 of 9 series · 13 of 46 positions shown, 18 images · IV contrast (OMNIPAQUE)
Comparison: 07/05/2014.

CLINICAL DATA: Pancreatic cancer with nausea. Chemotherapy in
progress.

EXAM:
CT CHEST, ABDOMEN, AND PELVIS WITH CONTRAST
TECHNIQUE: Multidetector CT imaging of the chest, abdomen and pelvis was
performed following the standard protocol during bolus
administration of intravenous contrast.
CONTRAST:  100mL OMNIPAQUE IOHEXOL 300 MG/ML  SOLN

[Series 6: venous thins pacs · axial · portal-venous · 0.66mm/px · z∈[-612,-81]mm · 10 of 213 slices shown, 15 images]
[im 18/213  soft-tissue]
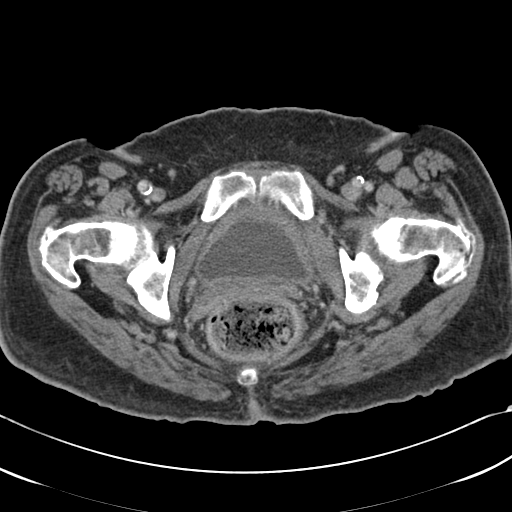
[im 18/213  bone]
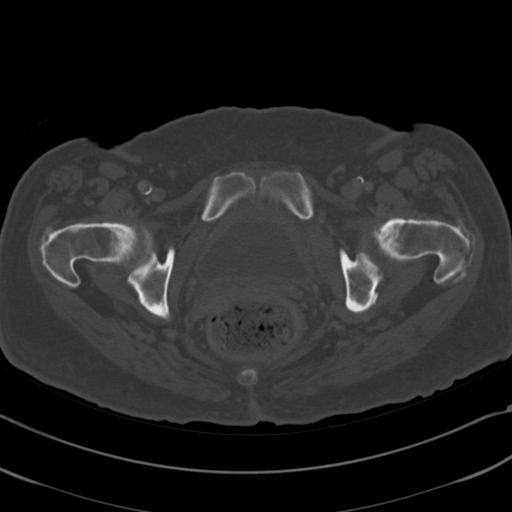
[im 36/213  soft-tissue]
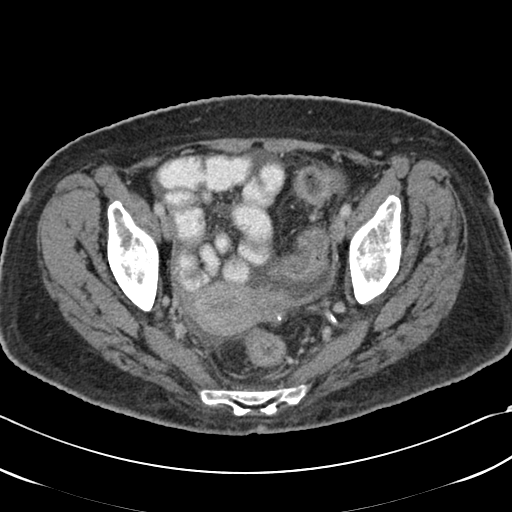
[im 71/213  soft-tissue]
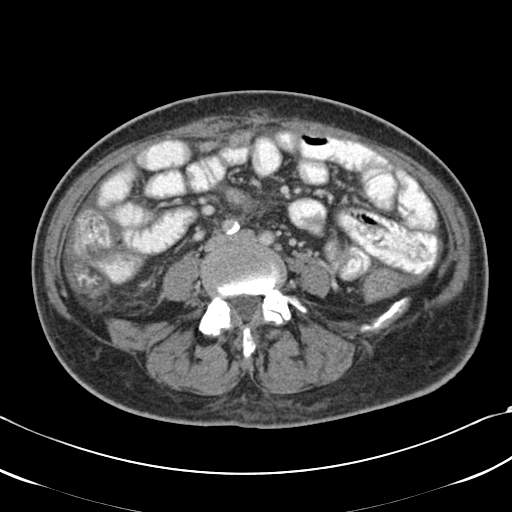
[im 89/213  soft-tissue]
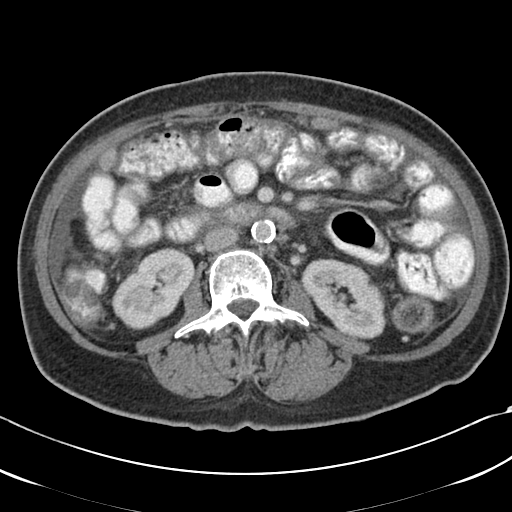
[im 107/213  soft-tissue]
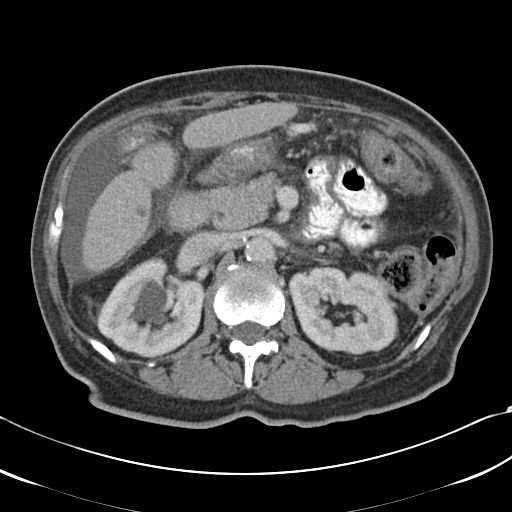
[im 124/213  soft-tissue]
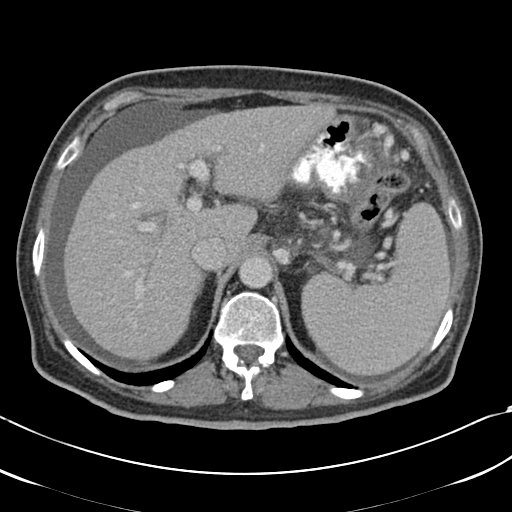
[im 142/213  soft-tissue]
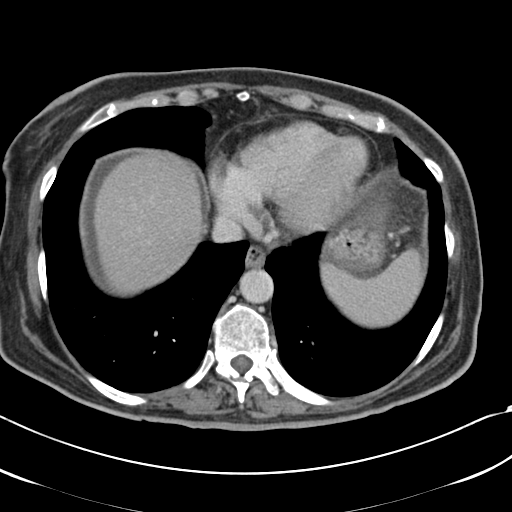
[im 142/213  lung]
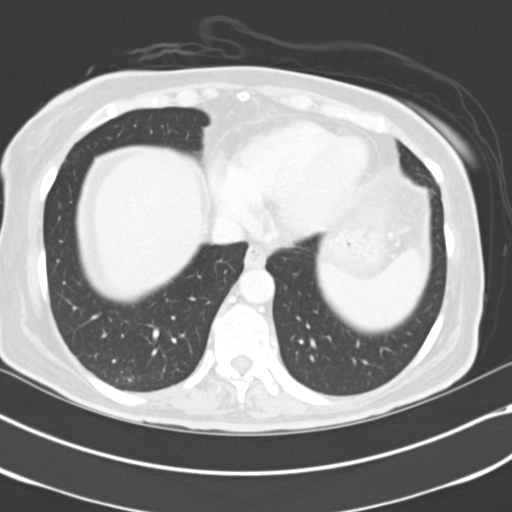
[im 160/213  lung]
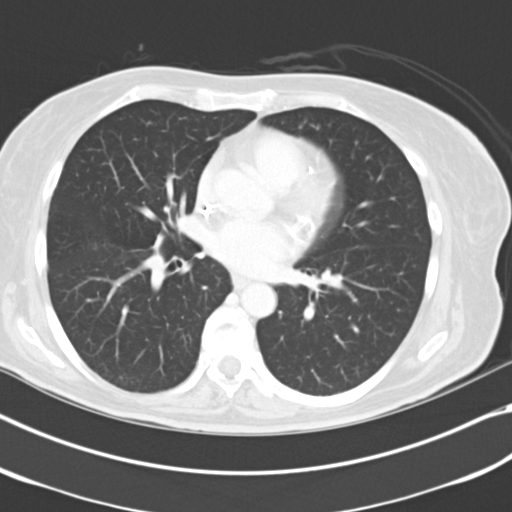
[im 177/213  soft-tissue]
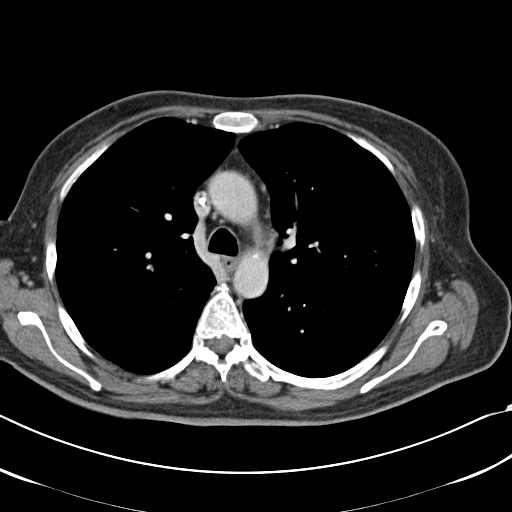
[im 177/213  lung]
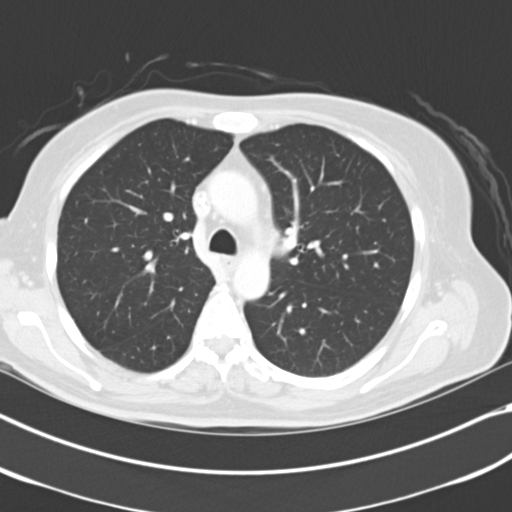
[im 195/213  soft-tissue]
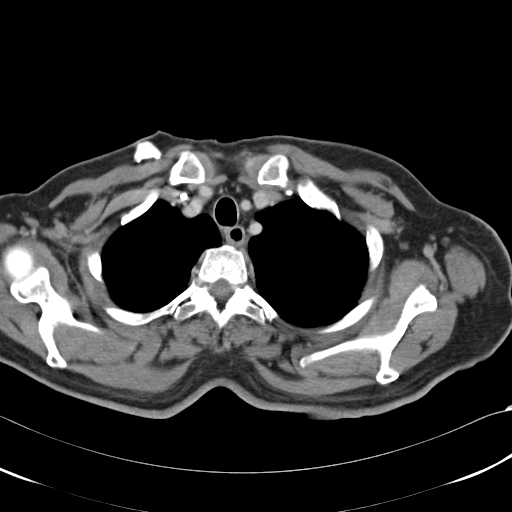
[im 195/213  lung]
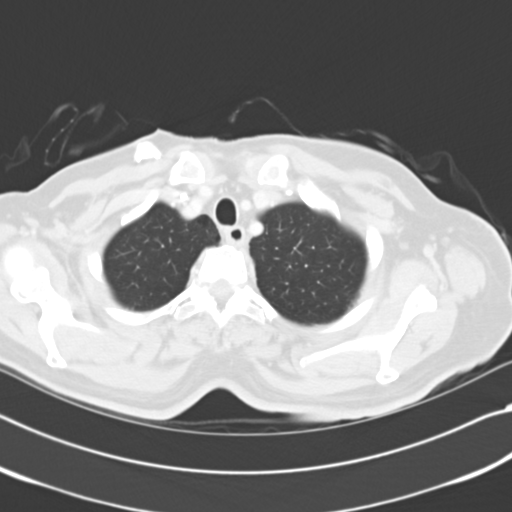
[im 195/213  bone]
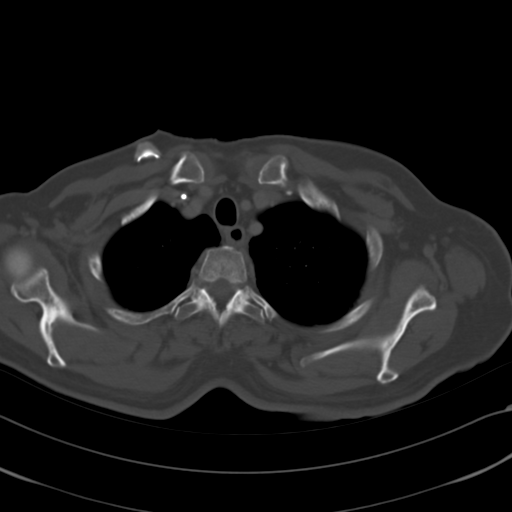

[Series 602: <mpr thick range> · coronal · 0.66mm/px · 3 of 81 slices shown]
[im 21/81  soft-tissue]
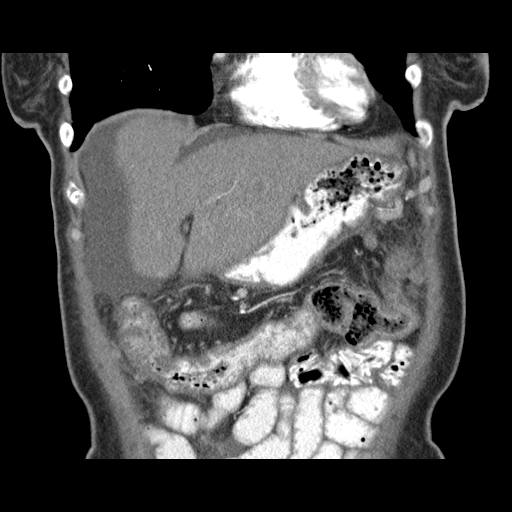
[im 41/81  soft-tissue]
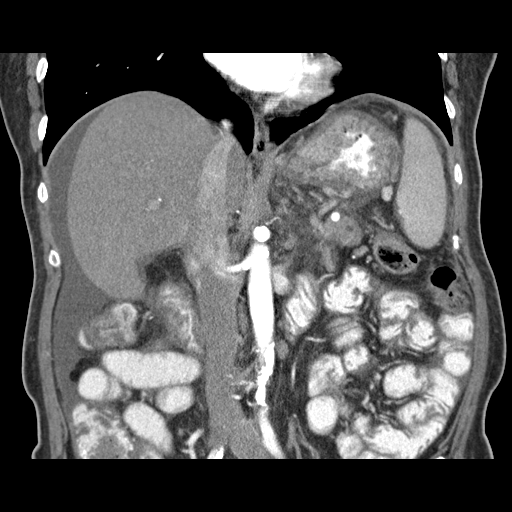
[im 61/81  soft-tissue]
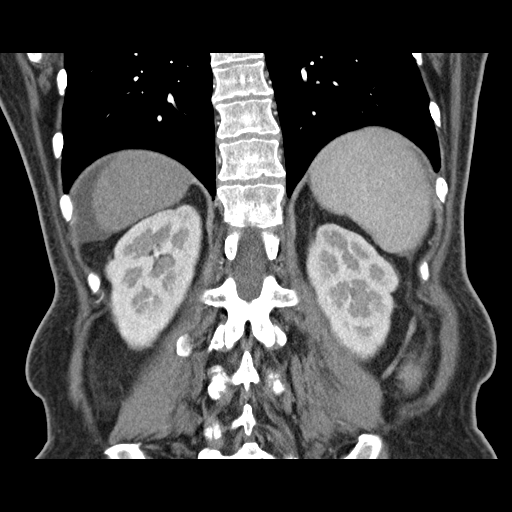

[13 of 46 positions shown; findings below may reference images not displayed]

FINDINGS: CT CHEST FINDINGS

Right IJ Port-A-Cath terminates at the SVC RA junction. No
pathologically enlarged mediastinal, hilar or axillary lymph nodes.
Coronary artery calcification. Heart size within normal limits. No
pericardial effusion.

Mild subpleural peribronchovascular nodularity in the right lower
lobe, unchanged and likely post infectious or postinflammatory in
etiology. Lungs are otherwise clear. No pleural fluid. Airway is
unremarkable.

CT ABDOMEN AND PELVIS FINDINGS

Hepatobiliary: Liver margin is irregular. Low-attenuation lesion in
the inferior right hepatic lobe measures 8 mm, stable. Very mild
intrahepatic biliary ductal dilatation is unchanged.
Cholecystectomy. Extrahepatic bile duct is normal in caliber.

Pancreas: Pancreatic tail mass is somewhat ill-defined, measuring
approximately 2.1 x 2.8 cm (series 2, image 40), stable. Mild
surrounding haziness. Pancreas is otherwise unremarkable.

Spleen: Negative.

Adrenals/Urinary Tract: Adrenal glands are unremarkable. Renal
cortical scarring bilaterally. Low-attenuation lesions in the
kidneys measure up to 2.0 cm in the right renal sinus, likely cysts.
Kidneys are otherwise unremarkable. Ureters are decompressed.
Bladder appears thick-walled but is somewhat under distended.

Stomach/Bowel: Stomach is grossly unremarkable. Proximal duodenum
appears slightly thick walled. Small bowel and colon are otherwise
grossly unremarkable.

Vascular/Lymphatic: Atherosclerotic calcification of the arterial
vasculature without abdominal aortic aneurysm. Splenic vein
occlusion, as before, with associated varices. Additional varices in
the upper abdomen, surrounding the stomach, gastroesophageal
junction and distal esophagus. Recanalized paraumbilical vein.
Retroperitoneal lymph nodes are not enlarged by CT size criteria.

Reproductive: Uterus and ovaries are visualized.

Other: Omental nodularity has improved somewhat in the interval. A
previously measured conglomerate collection of lymph nodes in the
left lower quadrant has decreased in size. Largest individual nodule
measures 0.8 x 1.3 cm (series 6, image 123), previously 1.4 x
cm. Small to moderate ascites, increased.

Musculoskeletal: No worrisome lytic or sclerotic lesions.
Degenerative changes are seen in the spine.
IMPRESSION: 1. Pancreatic tail mass is stable.
2. Omental nodularity has improved slightly in the interval.
3. Small to moderate ascites, increased.
4. Cirrhosis with prominent varices.
5. Bladder appears thick-walled but is suboptimally distended.

## 2015-08-12 IMAGING — US US PARACENTESIS
1 series · 8 of 8 positions shown · non-contrast
Comparison: 11/09/14 paracentesis.

MEDICATIONS:
None.

COMPLICATIONS:
None immediate

INDICATION: Pancreatic carcinoma, recurrent ascites and request for
paracentesis.

EXAM:
ULTRASOUND-GUIDED PARACENTESIS
TECHNIQUE: Informed written consent was obtained from the patient after a
discussion of the risks, benefits and alternatives to treatment. A
timeout was performed prior to the initiation of the procedure.

[Series 1: us paracentesis · 0.25mm/px · 8 of 8 slices shown]
[im 1/8]
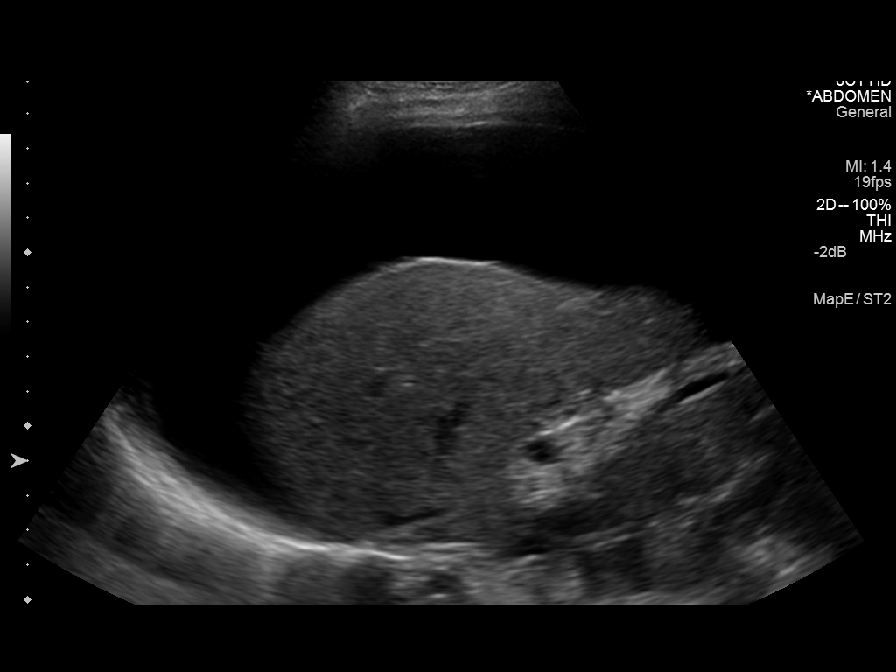
[im 2/8]
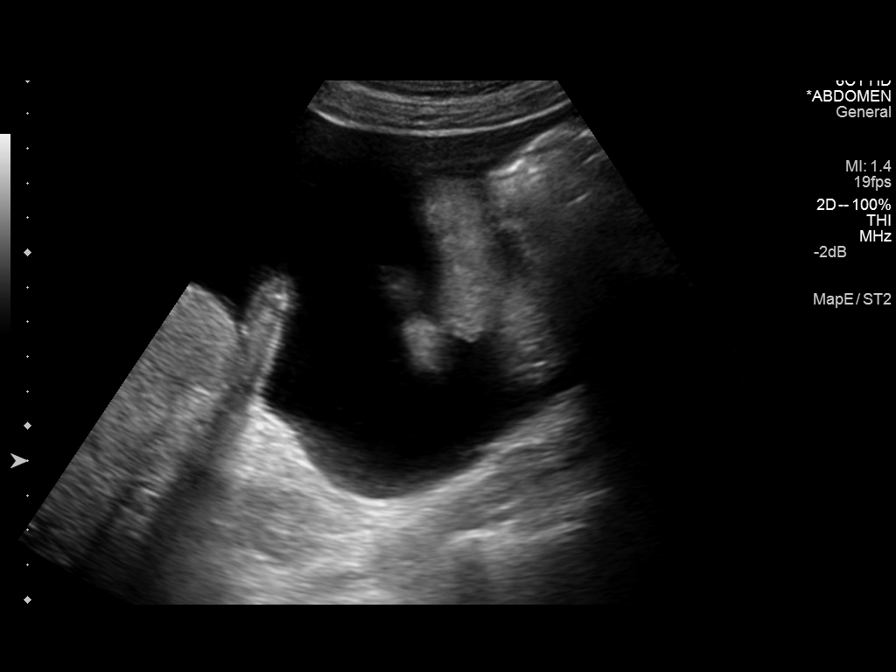
[im 3/8]
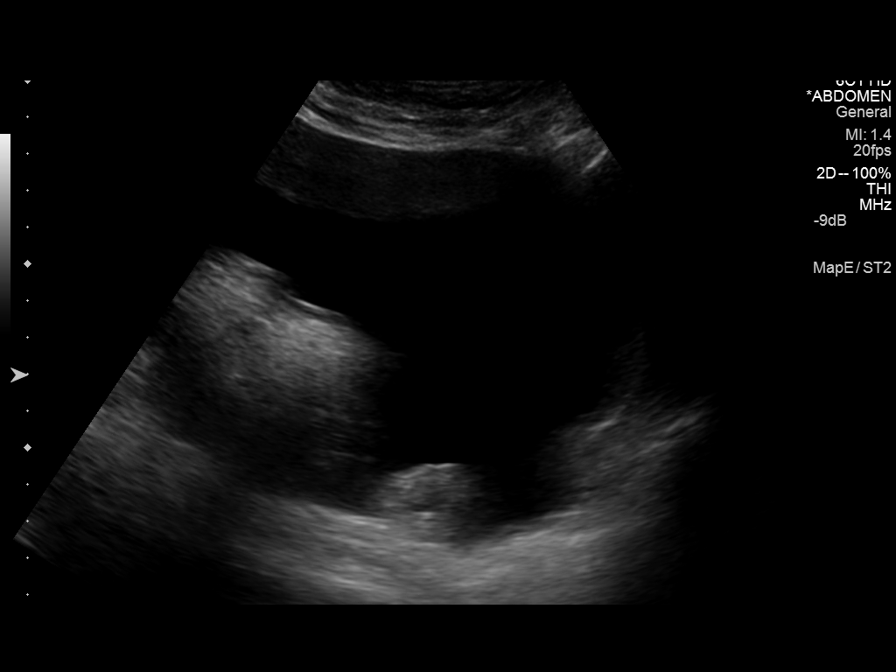
[im 4/8]
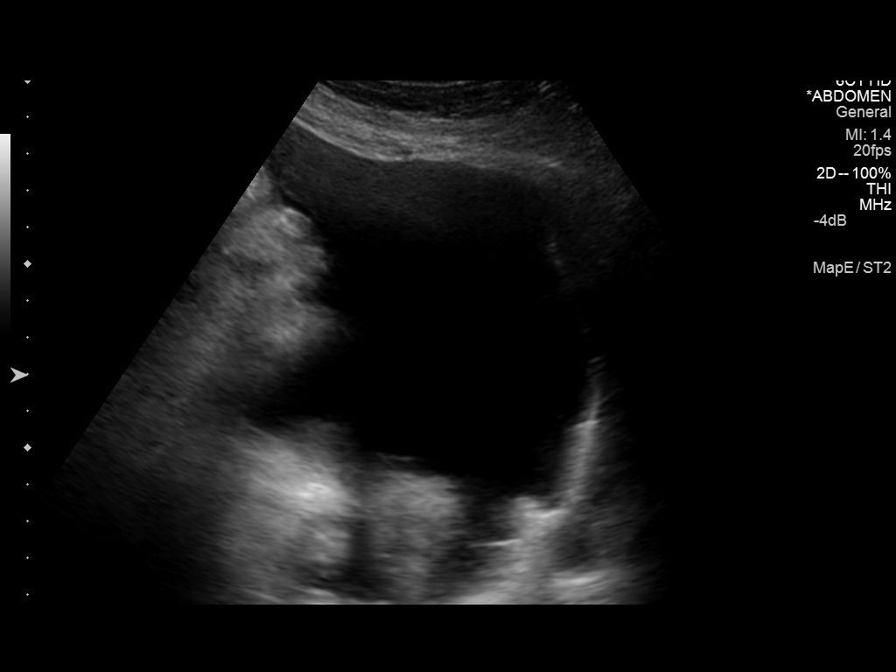
[im 5/8]
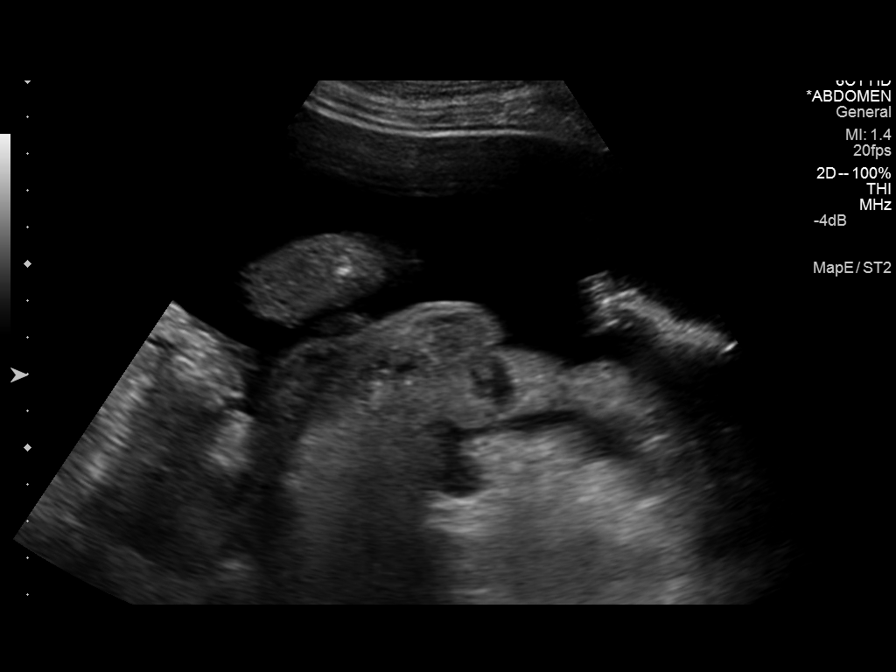
[im 6/8]
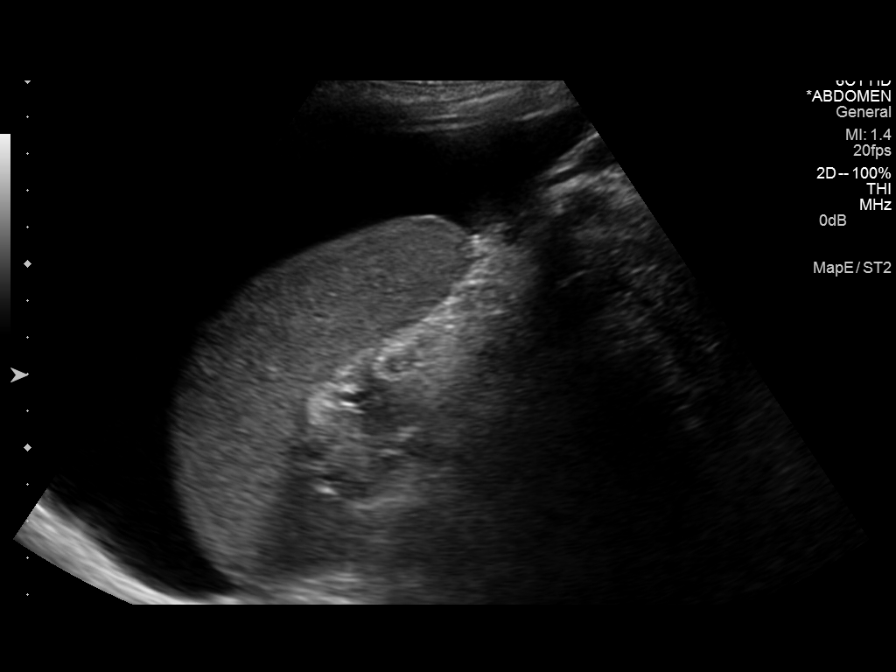
[im 7/8]
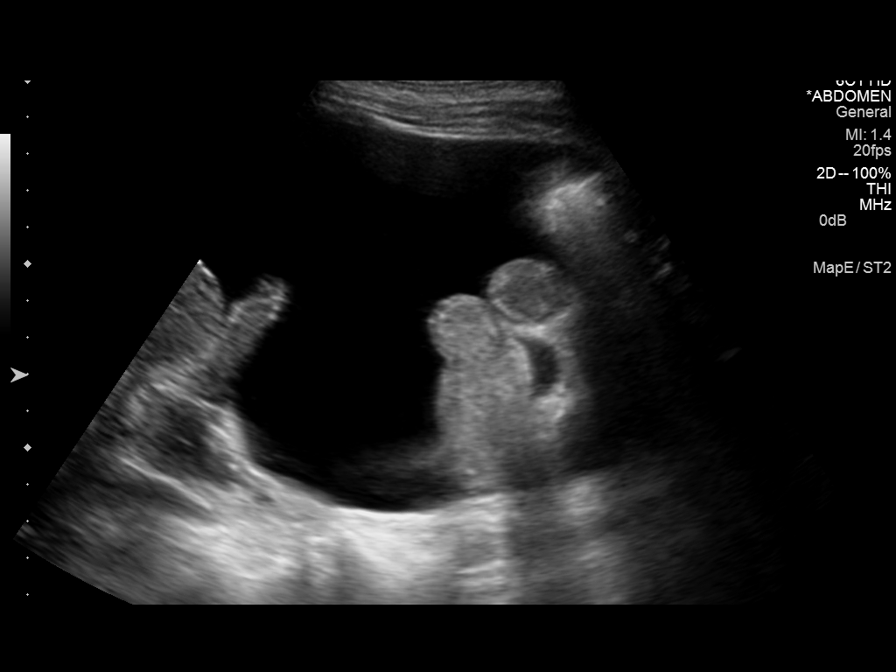
[im 8/8]
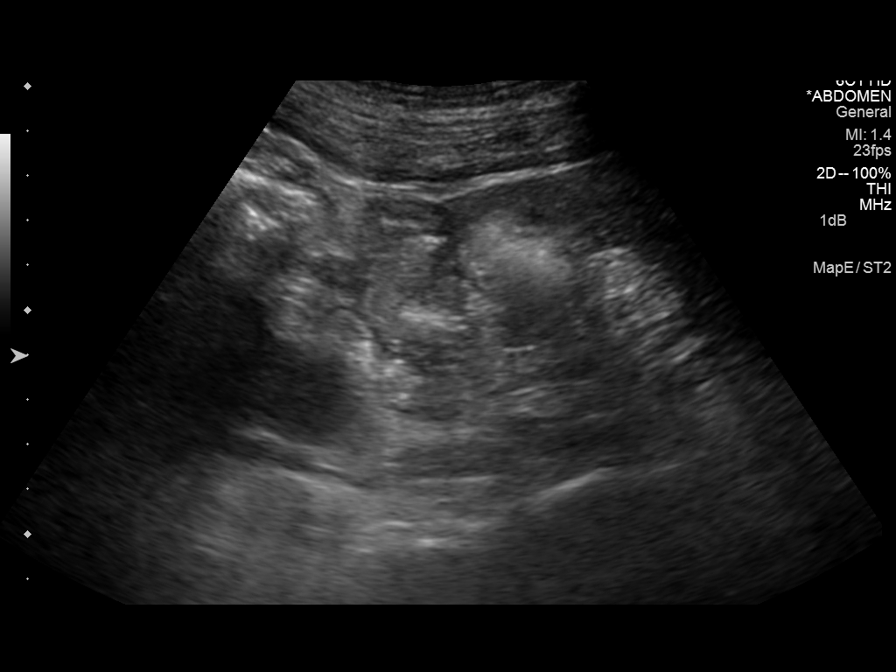

[8 of 8 positions shown; findings below may reference images not displayed]

Initial ultrasound scanning demonstrates a large amount of ascites
within the right lower abdominal quadrant. The right lower abdomen
was prepped and draped in the usual sterile fashion. 1% lidocaine
was used for local anesthesia. An ultrasound image was saved for
documentation purposed. An 6 Fr Safe-T-Centesis catheter was
introduced. The paracentesis was performed. The catheter was removed
and a dressing was applied. The patient tolerated the procedure well
without immediate post procedural complication.
FINDINGS: A total of approximately 6.4 liters of serous fluid was removed.
IMPRESSION: Successful ultrasound-guided paracentesis yielding 6.4 liters of
peritoneal fluid.

## 2016-03-20 ENCOUNTER — Other Ambulatory Visit: Payer: Self-pay | Admitting: Nurse Practitioner
